# Patient Record
Sex: Male | Born: 1955 | Race: White | Hispanic: No | Marital: Single | Smoking: Current every day smoker
Health system: Southern US, Community
[De-identification: ages and names within clinical notes are randomized; demographics above are authoritative.]

## PROBLEM LIST (undated history)

## (undated) DIAGNOSIS — R4182 Altered mental status, unspecified: Secondary | ICD-10-CM

## (undated) DIAGNOSIS — E871 Hypo-osmolality and hyponatremia: Secondary | ICD-10-CM

## (undated) DIAGNOSIS — I10 Essential (primary) hypertension: Secondary | ICD-10-CM

## (undated) HISTORY — PX: HERNIA REPAIR: SHX51

## (undated) HISTORY — DX: Hypo-osmolality and hyponatremia: E87.1

## (undated) HISTORY — DX: Altered mental status, unspecified: R41.82

---

## 2016-02-04 DIAGNOSIS — I10 Essential (primary) hypertension: Secondary | ICD-10-CM | POA: Insufficient documentation

## 2017-03-17 DIAGNOSIS — I739 Peripheral vascular disease, unspecified: Secondary | ICD-10-CM | POA: Insufficient documentation

## 2017-03-17 DIAGNOSIS — R9431 Abnormal electrocardiogram [ECG] [EKG]: Secondary | ICD-10-CM

## 2017-03-17 DIAGNOSIS — F17209 Nicotine dependence, unspecified, with unspecified nicotine-induced disorders: Secondary | ICD-10-CM | POA: Insufficient documentation

## 2017-03-17 DIAGNOSIS — J4520 Mild intermittent asthma, uncomplicated: Secondary | ICD-10-CM | POA: Insufficient documentation

## 2017-03-17 DIAGNOSIS — K439 Ventral hernia without obstruction or gangrene: Secondary | ICD-10-CM | POA: Insufficient documentation

## 2017-03-17 DIAGNOSIS — E669 Obesity, unspecified: Secondary | ICD-10-CM | POA: Insufficient documentation

## 2017-03-17 DIAGNOSIS — D229 Melanocytic nevi, unspecified: Secondary | ICD-10-CM | POA: Insufficient documentation

## 2017-03-17 DIAGNOSIS — E66812 Obesity, class 2: Secondary | ICD-10-CM | POA: Insufficient documentation

## 2017-03-17 DIAGNOSIS — J310 Chronic rhinitis: Secondary | ICD-10-CM | POA: Insufficient documentation

## 2017-03-17 DIAGNOSIS — R6882 Decreased libido: Secondary | ICD-10-CM | POA: Insufficient documentation

## 2017-03-17 DIAGNOSIS — K429 Umbilical hernia without obstruction or gangrene: Secondary | ICD-10-CM | POA: Insufficient documentation

## 2017-03-17 HISTORY — DX: Abnormal electrocardiogram (ECG) (EKG): R94.31

## 2019-02-19 ENCOUNTER — Emergency Department (HOSPITAL_BASED_OUTPATIENT_CLINIC_OR_DEPARTMENT_OTHER): Payer: Self-pay

## 2019-02-19 ENCOUNTER — Encounter (HOSPITAL_BASED_OUTPATIENT_CLINIC_OR_DEPARTMENT_OTHER): Payer: Self-pay | Admitting: Emergency Medicine

## 2019-02-19 ENCOUNTER — Inpatient Hospital Stay (HOSPITAL_BASED_OUTPATIENT_CLINIC_OR_DEPARTMENT_OTHER)
Admission: EM | Admit: 2019-02-19 | Discharge: 2019-02-22 | DRG: 640 | Disposition: A | Discharge status: Home / Self Care | Payer: Self-pay | Attending: Family Medicine | Admitting: Family Medicine

## 2019-02-19 ENCOUNTER — Other Ambulatory Visit: Payer: Self-pay

## 2019-02-19 DIAGNOSIS — D72829 Elevated white blood cell count, unspecified: Secondary | ICD-10-CM | POA: Diagnosis present

## 2019-02-19 DIAGNOSIS — E876 Hypokalemia: Secondary | ICD-10-CM | POA: Diagnosis present

## 2019-02-19 DIAGNOSIS — F22 Delusional disorders: Secondary | ICD-10-CM | POA: Diagnosis present

## 2019-02-19 DIAGNOSIS — Z560 Unemployment, unspecified: Secondary | ICD-10-CM

## 2019-02-19 DIAGNOSIS — F439 Reaction to severe stress, unspecified: Secondary | ICD-10-CM | POA: Diagnosis present

## 2019-02-19 DIAGNOSIS — F419 Anxiety disorder, unspecified: Secondary | ICD-10-CM | POA: Diagnosis present

## 2019-02-19 DIAGNOSIS — E871 Hypo-osmolality and hyponatremia: Principal | ICD-10-CM | POA: Diagnosis present

## 2019-02-19 DIAGNOSIS — F39 Unspecified mood [affective] disorder: Secondary | ICD-10-CM | POA: Diagnosis present

## 2019-02-19 DIAGNOSIS — K439 Ventral hernia without obstruction or gangrene: Secondary | ICD-10-CM | POA: Diagnosis present

## 2019-02-19 DIAGNOSIS — R0602 Shortness of breath: Secondary | ICD-10-CM

## 2019-02-19 DIAGNOSIS — F17210 Nicotine dependence, cigarettes, uncomplicated: Secondary | ICD-10-CM | POA: Diagnosis present

## 2019-02-19 DIAGNOSIS — E872 Acidosis, unspecified: Secondary | ICD-10-CM | POA: Diagnosis present

## 2019-02-19 DIAGNOSIS — F17200 Nicotine dependence, unspecified, uncomplicated: Secondary | ICD-10-CM | POA: Diagnosis present

## 2019-02-19 DIAGNOSIS — G9341 Metabolic encephalopathy: Secondary | ICD-10-CM | POA: Diagnosis present

## 2019-02-19 DIAGNOSIS — R4182 Altered mental status, unspecified: Secondary | ICD-10-CM

## 2019-02-19 DIAGNOSIS — T502X5A Adverse effect of carbonic-anhydrase inhibitors, benzothiadiazides and other diuretics, initial encounter: Secondary | ICD-10-CM | POA: Diagnosis present

## 2019-02-19 DIAGNOSIS — I10 Essential (primary) hypertension: Secondary | ICD-10-CM | POA: Diagnosis present

## 2019-02-19 DIAGNOSIS — G4700 Insomnia, unspecified: Secondary | ICD-10-CM | POA: Diagnosis present

## 2019-02-19 HISTORY — DX: Essential (primary) hypertension: I10

## 2019-02-19 LAB — CBC
HCT: 43 % (ref 39.0–52.0)
Hemoglobin: 14.9 g/dL (ref 13.0–17.0)
MCH: 30.5 pg (ref 26.0–34.0)
MCHC: 34.7 g/dL (ref 30.0–36.0)
MCV: 87.9 fL (ref 80.0–100.0)
Platelets: 283 10*3/uL (ref 150–400)
RBC: 4.89 MIL/uL (ref 4.22–5.81)
RDW: 12.7 % (ref 11.5–15.5)
WBC: 18.3 10*3/uL — ABNORMAL HIGH (ref 4.0–10.5)
nRBC: 0 % (ref 0.0–0.2)

## 2019-02-19 LAB — COMPREHENSIVE METABOLIC PANEL
ALBUMIN: 3.9 g/dL (ref 3.5–5.0)
ALT: 33 U/L (ref 0–44)
AST: 32 U/L (ref 15–41)
Alkaline Phosphatase: 75 U/L (ref 38–126)
Anion gap: 11 (ref 5–15)
BILIRUBIN TOTAL: 0.5 mg/dL (ref 0.3–1.2)
BUN: 11 mg/dL (ref 6–20)
CALCIUM: 8.5 mg/dL — AB (ref 8.9–10.3)
CO2: 20 mmol/L — ABNORMAL LOW (ref 22–32)
Chloride: 87 mmol/L — ABNORMAL LOW (ref 98–111)
Creatinine, Ser: 0.84 mg/dL (ref 0.61–1.24)
GFR calc Af Amer: >60 mL/min (ref >60)
GFR calc non Af Amer: >60 mL/min (ref >60)
Glucose, Bld: 100 mg/dL — ABNORMAL HIGH (ref 70–99)
Potassium: 3.1 mmol/L — ABNORMAL LOW (ref 3.5–5.1)
Sodium: 118 mmol/L — CL (ref 135–145)
Total Protein: 6.8 g/dL (ref 6.5–8.1)

## 2019-02-19 LAB — AMMONIA: AMMONIA: 18 umol/L (ref 9–35)

## 2019-02-19 LAB — ETHANOL

## 2019-02-19 LAB — RAPID URINE DRUG SCREEN, HOSP PERFORMED
Amphetamines: NOT DETECTED
Barbiturates: NOT DETECTED
Benzodiazepines: NOT DETECTED
Cocaine: NOT DETECTED
Opiates: NOT DETECTED
Tetrahydrocannabinol: NOT DETECTED

## 2019-02-19 MED ORDER — SODIUM CHLORIDE 0.9 % IV SOLN
INTRAVENOUS | Status: DC
  Administered 2019-02-19: via INTRAVENOUS

## 2019-02-19 MED ORDER — SODIUM CHLORIDE 0.9 % IV BOLUS
1000.0000 mL | Freq: Once | INTRAVENOUS | Status: AC
  Administered 2019-02-19: 1000 mL via INTRAVENOUS

## 2019-02-19 MED ORDER — POTASSIUM CHLORIDE 10 MEQ/100ML IV SOLN
10.0000 meq | Freq: Once | INTRAVENOUS | Status: AC
  Administered 2019-02-19: 10 meq via INTRAVENOUS
  Filled 2019-02-19: qty 100

## 2019-02-19 NOTE — ED Notes (Signed)
Pt forgot to give urine sample.

## 2019-02-19 NOTE — ED Notes (Signed)
Date and time results received: 02/19/19 2140  Test: sodium Critical Value: 118  Name of Provider Notified: Dr. Stark Jock  Orders Received? Or Actions Taken?: no new orders at this time.

## 2019-02-19 NOTE — ED Notes (Signed)
Pt refusing to be admitted. Explained to pt process and benefit. Pt still refuses. Family with pt. MD informed

## 2019-02-19 NOTE — ED Notes (Signed)
ED Provider at bedside. 

## 2019-02-19 NOTE — ED Notes (Signed)
PT refusing to be on cardiac monitor.

## 2019-02-19 NOTE — ED Notes (Signed)
PT refusing blood work. Wants RN to come back in 5 minutes to see if he wants to be admitted

## 2019-02-19 NOTE — ED Notes (Signed)
PT states he is very anxious and hasnt been sleeping. Denies thoughts of harming self.

## 2019-02-19 NOTE — ED Triage Notes (Signed)
Per family pt is more disoriented today, pt not able to drink or eat that much today, pt states he is been worry more about different stuff and not able to sleep well since last Friday, pt is AO x 4 on triage no neuro deficit noticed.

## 2019-02-19 NOTE — ED Notes (Signed)
ED Provider at bedside.Explained importance. Pt still refusing. PT states he will be admitted.

## 2019-02-19 NOTE — ED Notes (Signed)
Pt refusing CT scan. MD informed

## 2019-02-19 NOTE — ED Provider Notes (Signed)
[facility] Provider Note   CSN: [id number] Arrival date & time: 02/19/19  1902    History   Chief Complaint Chief Complaint  Patient presents with  . Altered Mental Status    HPI Francis Soto is a 63 y.o. male.     Patient is a 63 year old male presenting to the emergency department accompanied by his sister and his niece for evaluation of confusion and altered mental status.  According to the family, this patient recently lost his job of 14 years and has been very stressed out since.  They state that he has not been eating or drinking as much as normal and has not been sleeping as he has felt very stressed out.  The family is noticing that he is having issues with short-term memory and not quite understanding instructions.  Patient denies to me that he is experiencing any discomfort.  The family tells me there has been no history of alcoholism or substance abuse.  He has no history of mental illness.  The history is provided by the patient and a relative (Patient's sister and niece).  Altered Mental Status  Presenting symptoms: behavior changes and disorientation   Severity:  Moderate Episode history:  Continuous Timing:  Constant Progression:  Worsening Chronicity:  New Context: not alcohol use, not dementia, not drug use and not head injury     Past Medical History:  Diagnosis Date  . Hypertension     There are no active problems to display for this patient.   History reviewed. No pertinent surgical history.      Home Medications    Prior to Admission medications   Medication Sig Start Date End Date Taking? Authorizing Provider  fluticasone (FLONASE) 50 MCG/ACT nasal spray USE 1 SPRAY IN EACH NOSTRIL DAILY 02/14/19  Yes [provider]  lisinopril-hydrochlorothiazide (PRINZIDE,ZESTORETIC) 20-25 MG tablet Take by mouth. 02/12/19 02/12/20 Yes [provider]    Family History History reviewed. No pertinent family  history.  Social History Social History   Tobacco Use  . Smoking status: Current Every Day Smoker    Packs/day: 1.00    Types: Cigarettes  . Smokeless tobacco: Never Used  Substance Use Topics  . Alcohol use: Never    Frequency: Never  . Drug use: Never     Allergies   Patient has no known allergies.   Review of Systems Review of Systems  All other systems reviewed and are negative.    Physical Exam Updated Vital Signs BP (!) 177/90 (BP Location: Right Arm)   Pulse (!) 101   Temp 98.3 F (36.8 C) (Oral)   Resp 20   Ht 5\' 7"  (1.702 m)   Wt 95.3 kg   SpO2 100%   BMI 32.89 kg/m   Physical Exam Vitals signs and nursing note reviewed.  Constitutional:      General: He is not in acute distress.    Appearance: He is well-developed. He is not diaphoretic.  HENT:     Head: Normocephalic and atraumatic.  Eyes:     Extraocular Movements: Extraocular movements intact.     Pupils: Pupils are equal, round, and reactive to light.  Neck:     Musculoskeletal: Normal range of motion and neck supple.  Cardiovascular:     Rate and Rhythm: Normal rate and regular rhythm.     Heart sounds: No murmur. No friction rub.  Pulmonary:     Effort: Pulmonary effort is normal. No respiratory distress.  Breath sounds: Normal breath sounds. No wheezing or rales.  Abdominal:     General: Bowel sounds are normal. There is no distension.     Palpations: Abdomen is soft.     Tenderness: There is no abdominal tenderness.  Musculoskeletal: Normal range of motion.  Skin:    General: Skin is warm and dry.  Neurological:     General: No focal deficit present.     Mental Status: He is alert and oriented to person, place, and time.     Cranial Nerves: No cranial nerve deficit.     Motor: No weakness.     Coordination: Coordination normal.      ED Treatments / Results  Labs (all labs ordered are listed, but only abnormal results are displayed) Labs Reviewed  COMPREHENSIVE  METABOLIC PANEL  CBC  ETHANOL  RAPID URINE DRUG SCREEN, HOSP PERFORMED  AMMONIA    EKG None  Radiology No results found.  Procedures Procedures (including critical care time)  Medications Ordered in ED Medications  sodium chloride 0.9 % bolus 1,000 mL (has no administration in time range)     Initial Impression / Assessment and Plan / ED Course  I have reviewed the triage vital signs and the nursing notes.  Pertinent labs & imaging results that were available during my care of the patient were reviewed by me and considered in my medical decision making (see chart for details).  Patient presents here with complaints of weakness and confusion that has worsened over the past several weeks.  The patient recently lost his job after 14 years of employment and has been stressed out over this.  He states he has not been sleeping or eating much during this period of time.  Today's work-up reveals a sodium of 118 with a potassium of 3.1, which I suspect is the cause of his confusion.  He was given 1 L of normal saline and will be admitted to the hospitalist service under the care of Dr. Blaine Hamper.  The patient will need additional studies to determine the cause of his hyponatremia.  At this point he is declining to allow me to perform a head CT.  Hopefully he will reconsider once seen by the hospitalist.  Patient was also very reluctant to be admitted.  After several conversations with the patient and the patient's family, he ultimately decided to stay.  Final Clinical Impressions(s) / ED Diagnoses   Final diagnoses:  None    ED Discharge Orders    None       Veryl Speak, MD 02/19/19 2325

## 2019-02-19 NOTE — ED Notes (Signed)
MD in to see pt. Pt agreeing to admission. Consent obtained.

## 2019-02-20 ENCOUNTER — Inpatient Hospital Stay (HOSPITAL_COMMUNITY): Payer: Self-pay

## 2019-02-20 DIAGNOSIS — G9341 Metabolic encephalopathy: Secondary | ICD-10-CM | POA: Diagnosis present

## 2019-02-20 DIAGNOSIS — E872 Acidosis, unspecified: Secondary | ICD-10-CM | POA: Diagnosis present

## 2019-02-20 DIAGNOSIS — F17200 Nicotine dependence, unspecified, uncomplicated: Secondary | ICD-10-CM | POA: Diagnosis present

## 2019-02-20 DIAGNOSIS — E876 Hypokalemia: Secondary | ICD-10-CM | POA: Diagnosis present

## 2019-02-20 DIAGNOSIS — G4700 Insomnia, unspecified: Secondary | ICD-10-CM | POA: Diagnosis present

## 2019-02-20 DIAGNOSIS — D72829 Elevated white blood cell count, unspecified: Secondary | ICD-10-CM | POA: Diagnosis present

## 2019-02-20 DIAGNOSIS — E871 Hypo-osmolality and hyponatremia: Principal | ICD-10-CM

## 2019-02-20 HISTORY — DX: Metabolic encephalopathy: G93.41

## 2019-02-20 HISTORY — DX: Acidosis, unspecified: E87.20

## 2019-02-20 HISTORY — DX: Hypokalemia: E87.6

## 2019-02-20 LAB — BRAIN NATRIURETIC PEPTIDE: B Natriuretic Peptide: 49 pg/mL (ref 0.0–100.0)

## 2019-02-20 LAB — URIC ACID: URIC ACID, SERUM: 5.4 mg/dL (ref 3.7–8.6)

## 2019-02-20 LAB — BASIC METABOLIC PANEL
Anion gap: 11 (ref 5–15)
Anion gap: 11 (ref 5–15)
Anion gap: 8 (ref 5–15)
Anion gap: 6 (ref 5–15)
BUN: 8 mg/dL (ref 6–20)
BUN: 9 mg/dL (ref 6–20)
BUN: 9 mg/dL (ref 6–20)
BUN: 13 mg/dL (ref 6–20)
CALCIUM: 8.4 mg/dL — AB (ref 8.9–10.3)
CO2: 22 mmol/L (ref 22–32)
CO2: 19 mmol/L — ABNORMAL LOW (ref 22–32)
CO2: 20 mmol/L — ABNORMAL LOW (ref 22–32)
CO2: 22 mmol/L (ref 22–32)
Calcium: 8.3 mg/dL — ABNORMAL LOW (ref 8.9–10.3)
Calcium: 8.5 mg/dL — ABNORMAL LOW (ref 8.9–10.3)
Calcium: 8.6 mg/dL — ABNORMAL LOW (ref 8.9–10.3)
Chloride: 93 mmol/L — ABNORMAL LOW (ref 98–111)
Chloride: 98 mmol/L (ref 98–111)
Chloride: 102 mmol/L (ref 98–111)
Chloride: 104 mmol/L (ref 98–111)
Creatinine, Ser: 0.9 mg/dL (ref 0.61–1.24)
Creatinine, Ser: 0.98 mg/dL (ref 0.61–1.24)
Creatinine, Ser: 0.95 mg/dL (ref 0.61–1.24)
Creatinine, Ser: 1.14 mg/dL (ref 0.61–1.24)
GFR calc Af Amer: >60 mL/min (ref >60)
GFR calc Af Amer: >60 mL/min (ref >60)
GFR calc Af Amer: >60 mL/min (ref >60)
GFR calc Af Amer: >60 mL/min (ref >60)
GFR calc non Af Amer: >60 mL/min (ref >60)
GFR calc non Af Amer: >60 mL/min (ref >60)
GFR calc non Af Amer: >60 mL/min (ref >60)
GFR calc non Af Amer: >60 mL/min (ref >60)
GLUCOSE: 87 mg/dL (ref 70–99)
Glucose, Bld: 108 mg/dL — ABNORMAL HIGH (ref 70–99)
Glucose, Bld: 146 mg/dL — ABNORMAL HIGH (ref 70–99)
Glucose, Bld: 110 mg/dL — ABNORMAL HIGH (ref 70–99)
Potassium: 3.1 mmol/L — ABNORMAL LOW (ref 3.5–5.1)
Potassium: 3.2 mmol/L — ABNORMAL LOW (ref 3.5–5.1)
Potassium: 4.4 mmol/L (ref 3.5–5.1)
Potassium: 4.2 mmol/L (ref 3.5–5.1)
Sodium: 126 mmol/L — ABNORMAL LOW (ref 135–145)
Sodium: 128 mmol/L — ABNORMAL LOW (ref 135–145)
Sodium: 130 mmol/L — ABNORMAL LOW (ref 135–145)
Sodium: 132 mmol/L — ABNORMAL LOW (ref 135–145)

## 2019-02-20 LAB — SODIUM, URINE, RANDOM: Sodium, Ur: <10 mmol/L

## 2019-02-20 LAB — CBC WITH DIFFERENTIAL/PLATELET
ABS IMMATURE GRANULOCYTES: 0.06 10*3/uL (ref 0.00–0.07)
Basophils Absolute: 0.1 10*3/uL (ref 0.0–0.1)
Basophils Relative: 1 %
Eosinophils Absolute: 0.2 10*3/uL (ref 0.0–0.5)
Eosinophils Relative: 2 %
HCT: 43.3 % (ref 39.0–52.0)
Hemoglobin: 14.7 g/dL (ref 13.0–17.0)
IMMATURE GRANULOCYTES: 1 %
LYMPHS PCT: 20 %
Lymphs Abs: 2.2 10*3/uL (ref 0.7–4.0)
MCH: 31 pg (ref 26.0–34.0)
MCHC: 33.9 g/dL (ref 30.0–36.0)
MCV: 91.4 fL (ref 80.0–100.0)
Monocytes Absolute: 1.4 10*3/uL — ABNORMAL HIGH (ref 0.1–1.0)
Monocytes Relative: 12 %
Neutro Abs: 7.2 10*3/uL (ref 1.7–7.7)
Neutrophils Relative %: 64 %
Platelets: 269 10*3/uL (ref 150–400)
RBC: 4.74 MIL/uL (ref 4.22–5.81)
RDW: 13.1 % (ref 11.5–15.5)
WBC: 11 10*3/uL — ABNORMAL HIGH (ref 4.0–10.5)
nRBC: 0 % (ref 0.0–0.2)

## 2019-02-20 LAB — CORTISOL: CORTISOL PLASMA: 13.3 ug/dL

## 2019-02-20 LAB — OSMOLALITY, URINE: Osmolality, Ur: 105 mOsm/kg — ABNORMAL LOW (ref 300–900)

## 2019-02-20 LAB — OSMOLALITY
Osmolality: 253 mOsm/kg — ABNORMAL LOW (ref 275–295)
Osmolality: 264 mOsm/kg — ABNORMAL LOW (ref 275–295)

## 2019-02-20 LAB — URINALYSIS, ROUTINE W REFLEX MICROSCOPIC
Bilirubin Urine: NEGATIVE
Glucose, UA: NEGATIVE mg/dL
HGB URINE DIPSTICK: NEGATIVE
Ketones, ur: NEGATIVE mg/dL
Leukocytes,Ua: NEGATIVE
Nitrite: NEGATIVE
Protein, ur: NEGATIVE mg/dL
SPECIFIC GRAVITY, URINE: 1.003 — AB (ref 1.005–1.030)
pH: 7 (ref 5.0–8.0)

## 2019-02-20 LAB — MRSA PCR SCREENING: MRSA BY PCR: NEGATIVE

## 2019-02-20 LAB — MAGNESIUM: Magnesium: 2.1 mg/dL (ref 1.7–2.4)

## 2019-02-20 LAB — CREATININE, URINE, RANDOM: Creatinine, Urine: 34.23 mg/dL

## 2019-02-20 LAB — TSH: TSH: 1.207 u[IU]/mL (ref 0.350–4.500)

## 2019-02-20 MED ORDER — HYDRALAZINE HCL 20 MG/ML IJ SOLN
5.0000 mg | INTRAMUSCULAR | Status: DC | PRN
  Administered 2019-02-21: 5 mg via INTRAVENOUS
  Filled 2019-02-20: qty 1

## 2019-02-20 MED ORDER — ACETAMINOPHEN 650 MG RE SUPP: 650.0000 mg | Freq: Four times a day (QID) | RECTAL | Status: DC | PRN

## 2019-02-20 MED ORDER — POTASSIUM CHLORIDE CRYS ER 20 MEQ PO TBCR
40.0000 meq | EXTENDED_RELEASE_TABLET | ORAL | Status: AC
  Administered 2019-02-20 (×2): 40 meq via ORAL
  Filled 2019-02-20 (×2): qty 2

## 2019-02-20 MED ORDER — ALBUTEROL SULFATE HFA 108 (90 BASE) MCG/ACT IN AERS: 2.0000 | INHALATION_SPRAY | Freq: Three times a day (TID) | RESPIRATORY_TRACT | Status: DC

## 2019-02-20 MED ORDER — ACETAMINOPHEN 325 MG PO TABS: 650.0000 mg | ORAL_TABLET | Freq: Four times a day (QID) | ORAL | Status: DC | PRN

## 2019-02-20 MED ORDER — NICOTINE 21 MG/24HR TD PT24
21.0000 mg | MEDICATED_PATCH | Freq: Every day | TRANSDERMAL | Status: DC
  Administered 2019-02-20 – 2019-02-21 (×2): 21 mg via TRANSDERMAL
  Filled 2019-02-20 (×2): qty 1

## 2019-02-20 MED ORDER — POTASSIUM CHLORIDE 20 MEQ/15ML (10%) PO SOLN
40.0000 meq | Freq: Once | ORAL | Status: AC
  Administered 2019-02-20: 40 meq via ORAL
  Filled 2019-02-20: qty 30

## 2019-02-20 MED ORDER — NAPHAZOLINE-GLYCERIN 0.012-0.2 % OP SOLN: 1.0000 [drp] | Freq: Four times a day (QID) | OPHTHALMIC | Status: DC | PRN

## 2019-02-20 MED ORDER — FLUTICASONE PROPIONATE 50 MCG/ACT NA SUSP
1.0000 | Freq: Every day | NASAL | Status: DC
  Administered 2019-02-20 – 2019-02-21 (×2): 1 via NASAL
  Filled 2019-02-20: qty 16

## 2019-02-20 MED ORDER — LISINOPRIL 10 MG PO TABS: 20.0000 mg | ORAL_TABLET | Freq: Every day | ORAL | Status: DC

## 2019-02-20 MED ORDER — CHLORHEXIDINE GLUCONATE CLOTH 2 % EX PADS: 6.0000 | MEDICATED_PAD | Freq: Every day | CUTANEOUS | Status: DC

## 2019-02-20 MED ORDER — ALBUTEROL SULFATE HFA 108 (90 BASE) MCG/ACT IN AERS: 2.0000 | INHALATION_SPRAY | RESPIRATORY_TRACT | Status: DC | PRN

## 2019-02-20 MED ORDER — SODIUM CHLORIDE 0.9% FLUSH
3.0000 mL | Freq: Two times a day (BID) | INTRAVENOUS | Status: DC
  Administered 2019-02-20 – 2019-02-21 (×4): 3 mL via INTRAVENOUS

## 2019-02-20 MED ORDER — ASPIRIN 325 MG PO TABS
325.0000 mg | ORAL_TABLET | Freq: Every day | ORAL | Status: DC
  Administered 2019-02-20 – 2019-02-22 (×3): 325 mg via ORAL
  Filled 2019-02-20 (×3): qty 1

## 2019-02-20 MED ORDER — ALBUTEROL SULFATE (2.5 MG/3ML) 0.083% IN NEBU: 2.5000 mg | INHALATION_SOLUTION | Freq: Four times a day (QID) | RESPIRATORY_TRACT | Status: DC | PRN

## 2019-02-20 MED ORDER — AMLODIPINE BESYLATE 5 MG PO TABS
5.0000 mg | ORAL_TABLET | Freq: Every day | ORAL | Status: DC
  Administered 2019-02-20 – 2019-02-21 (×2): 5 mg via ORAL
  Filled 2019-02-20 (×2): qty 1

## 2019-02-20 MED ORDER — ONDANSETRON HCL 4 MG PO TABS: 4.0000 mg | ORAL_TABLET | Freq: Four times a day (QID) | ORAL | Status: DC | PRN

## 2019-02-20 MED ORDER — ONDANSETRON HCL 4 MG/2ML IJ SOLN: 4.0000 mg | Freq: Four times a day (QID) | INTRAMUSCULAR | Status: DC | PRN

## 2019-02-20 MED ORDER — DEXTROSE 5 % IV SOLN
INTRAVENOUS | Status: DC
  Administered 2019-02-20: 07:00:00 via INTRAVENOUS

## 2019-02-20 MED ORDER — NAPHAZOLINE-PHENIRAMINE 0.025-0.3 % OP SOLN
1.0000 [drp] | Freq: Four times a day (QID) | OPHTHALMIC | Status: DC | PRN
  Administered 2019-02-20: 1 [drp] via OPHTHALMIC
  Filled 2019-02-20 (×2): qty 15

## 2019-02-20 MED ORDER — ALBUTEROL SULFATE (2.5 MG/3ML) 0.083% IN NEBU: 2.5000 mg | INHALATION_SOLUTION | Freq: Three times a day (TID) | RESPIRATORY_TRACT | Status: DC

## 2019-02-20 MED ORDER — ONDANSETRON HCL 4 MG/2ML IJ SOLN: 4.0000 mg | Freq: Three times a day (TID) | INTRAMUSCULAR | Status: DC | PRN

## 2019-02-20 MED ORDER — SODIUM CHLORIDE 0.9 % IV SOLN
INTRAVENOUS | Status: DC
  Administered 2019-02-20: 10:00:00 via INTRAVENOUS

## 2019-02-20 MED ORDER — ENOXAPARIN SODIUM 40 MG/0.4ML ~~LOC~~ SOLN
40.0000 mg | SUBCUTANEOUS | Status: DC
  Administered 2019-02-20: 40 mg via SUBCUTANEOUS
  Filled 2019-02-20 (×3): qty 0.4

## 2019-02-20 NOTE — Care Management (Addendum)
This is a no charge note  Transfer from [facility] per Dr. Stark Jock,  63 year old man with past medical history of hypertension, tobacco abuse, who presents with altered mental status, insomnia, decreased oral intake for 1-2weeks per ED physician.  Patient was found to have sodium 118, WBC 18.2, negative UDS, ammonia level 18, alcohol level less than 10, renal function okay. Patient refused CT head.  Patient was given 1 L normal saline bolus in the ED. Will order urinalysis and blood culture. Will check stat BMP now for sodium level, then decide the rate of NS for next step. Pt is admitted to [facility] as inpt.    Addendum: after giving 1L NS bolus in ED, Na increased from 118 to 126 quickly, which is too fast. Will start D5 at 50 cc/h    Ivor Costa, MD  [facility]   If 7PM-7AM, please contact night-coverage [email or web address] Password [email or web address] 02/20/2019, 1:31 AM

## 2019-02-20 NOTE — Progress Notes (Signed)
Patient walked to bathroom unassisted after nurse tech unhooked telebox from room monitor, bowel urgency caused patient to have accident on the way to bathroom and IV tubing was disconnected on transfer.   Linens/pt gown changed, facilities assisted with cleaning bathroom, IV access redressed/new dressing placed and new IV tubing placed.   Educated patient on safety with ambulating and when to ask for help, pt verbalized understanding.

## 2019-02-20 NOTE — Progress Notes (Signed)
Patient assessed per RT protocol. BBS clear, no ShOB per patient, and no O2 requirements. Patient 98-99% on RA with no distress noted. Spoke to patient about scheduled treatments and patient wishes to be PRN at this time. Patient encouraged to notify if needed. RT will continue to monitor patient.

## 2019-02-20 NOTE — H&P (Signed)
[facility] History and Physical   Patient: Francis Soto BEE:[id number]   PCP: No primary care provider on file. DOB: April 11, 1956   DOA: 02/19/2019   DOS: 02/20/2019   DOS: the patient was seen and examined on 02/19/2019  Patient coming from: The patient is coming from home.  Chief Complaint: confusion  HPI: Francis Soto is a 63 y.o. male with Past medical history of hypertension, active smoker. The patient presents with complaints of confusion. Patient was brought in by family members as the patient was noted to have some short-term memory loss, was unable to follow command and was also not speaking coherently. 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 Patient is also sleeping less with 1 to 2 hours of sleep at a stretch. Denies any weight loss. Denies any nausea or vomiting.  No fever no chills.  No chest pain.  Abdominal pain.  No dizziness lightheadedness.  No diarrhea no constipation.  No burning urination. Denies any alcohol abuse.  No other substance abuse. He takes 6 aspirin full dose a day for generalized body ache and knee pain.  No injury reported as well.   Smokes 2-1/2 pack a day, currently willing to quit. He reports that he does not go to see his doctor on a frequent basis. He has a ventral hernia which she reports has been present for a while, in the beginning it was reducible but since last few years it is not reducible but does not have any pain.  ED Course: Presents with confusion brought in by family, found to have sodium of 118, given IV hydration and was referred for admission.  At his baseline ambulates without support And is independent for most of his ADL; manages his medication on his own.  Review of Systems: as mentioned in the history of present illness.  All other systems reviewed and are negative.  Past Medical  History:  Diagnosis Date  . Hypertension    History reviewed. No pertinent surgical history. Social History:  reports that he has been smoking cigarettes. He has been smoking about 1.00 pack per day. He has never used smokeless tobacco. He reports that he does not drink alcohol or use drugs.  No Known Allergies  History reviewed. No pertinent family history.   Prior to Admission medications   Medication Sig Start Date End Date Taking? Authorizing Provider  aspirin 325 MG tablet Take 650 mg by mouth 3 (three) times daily.   Yes [provider]  fluticasone (FLONASE) 50 MCG/ACT nasal spray Place 1 spray into both nostrils daily.  02/14/19  Yes [provider]  lisinopril-hydrochlorothiazide (PRINZIDE,ZESTORETIC) 20-25 MG tablet Take 1 tablet by mouth daily.  02/12/19 02/12/20 Yes [provider]    Physical Exam: Vitals:   02/20/19 0200 02/20/19 0435 02/20/19 0700 02/20/19 0815  BP: (!) 146/84     Pulse: 68  85   Resp:      Temp:  98 F (36.7 C)  98.9 F (37.2 C)  TempSrc:  Axillary  Oral  SpO2: 94%  96%   Weight:      Height:        General: Alert, Awake and Oriented to Time, Place and Person. Appear in mild distress, affect appropriate Eyes: PERRL, Conjunctiva normal ENT: Oral Mucosa clear moist. Neck: no JVD, no Abnormal Mass Or lumps Cardiovascular: S1 and S2  Present, no Murmur, Peripheral Pulses Present Respiratory: normal respiratory effort, Bilateral Air entry equal and Decreased, no use of accessory muscle, no Crackles, bilateral  wheezes Abdomen: Bowel Sound present, Soft and no tenderness, nonreducible ventral hernia Skin: no redness, no Rash, no induration Extremities: no Pedal edema, no calf tenderness Neurologic: Grossly no focal neuro deficit. Bilaterally Equal motor strength  Labs on Admission:  CBC: Recent Labs  Lab 02/19/19 2050  WBC 18.3*  HGB 14.9  HCT 43.0  MCV 87.9  PLT 673   Basic Metabolic Panel: Recent Labs  Lab  02/19/19 2050 02/20/19 0209 02/20/19 0754  NA 118* 126* 128*  K 3.1* 3.1* 3.2*  CL 87* 93* 98  CO2 20* 22 19*  GLUCOSE 100* 108* 146*  BUN 11 8 9   CREATININE 0.84 0.90 0.98  CALCIUM 8.5* 8.4* 8.3*   GFR: Estimated Creatinine Clearance: 88.2 mL/min (by C-G formula based on SCr of 0.98 mg/dL). Liver Function Tests: Recent Labs  Lab 02/19/19 2050  AST 32  ALT 33  ALKPHOS 75  BILITOT 0.5  PROT 6.8  ALBUMIN 3.9   No results for input(s): LIPASE, AMYLASE in the last 168 hours. Recent Labs  Lab 02/19/19 2100  AMMONIA 18   Coagulation Profile: No results for input(s): INR, PROTIME in the last 168 hours. Cardiac Enzymes: No results for input(s): CKTOTAL, CKMB, CKMBINDEX, TROPONINI in the last 168 hours. BNP (last 3 results) No results for input(s): PROBNP in the last 8760 hours. HbA1C: No results for input(s): HGBA1C in the last 72 hours. CBG: No results for input(s): GLUCAP in the last 168 hours. Lipid Profile: No results for input(s): CHOL, HDL, LDLCALC, TRIG, CHOLHDL, LDLDIRECT in the last 72 hours. Thyroid Function Tests: Recent Labs    02/20/19 0209  TSH 1.207   Anemia Panel: No results for input(s): VITAMINB12, FOLATE, FERRITIN, TIBC, IRON, RETICCTPCT in the last 72 hours. Urine analysis:    Component Value Date/Time   COLORURINE STRAW (A) 02/20/2019 0530   APPEARANCEUR CLEAR 02/20/2019 0530   LABSPEC 1.003 (L) 02/20/2019 0530   PHURINE 7.0 02/20/2019 0530   GLUCOSEU NEGATIVE 02/20/2019 0530   HGBUR NEGATIVE 02/20/2019 0530   BILIRUBINUR NEGATIVE 02/20/2019 0530   KETONESUR NEGATIVE 02/20/2019 0530   PROTEINUR NEGATIVE 02/20/2019 0530   NITRITE NEGATIVE 02/20/2019 0530   LEUKOCYTESUR NEGATIVE 02/20/2019 0530    Radiological Exams on Admission: No results found. EKG: Independently reviewed. normal sinus rhythm, T wave inversions in lateral leads.  Assessment/Plan 1. Hyponatremia hypoosmolar Acute metabolic encephalopathy The patient presents with  complaints of confusion. Found to have hyponatremia with sodium of 118. Potassium 3.1, TSH 1.2, osmolality 253, urine aspirin 8105, urine sodium undetectable. Patient received IV hydration with normal saline bolus in the ER with which his sodium improved to 126. She also received D5 between 6:52 AM and 7:22 AM at 50 cc/h. Currently repeat sodium is 128. Patient does not have any focal deficit. Significant improvement and patient actually feels that he is at his baseline. Currently differential includes no dietary salt intake HCTZ induced hyponatremia, type IV RTA secondary to lisinopril, SIADH. We will continue with gentle IV hydration for now. Monitor BMP every 4 hours. Check chest x-ray. Cortisol level is ordered. Hold lisinopril and HCTZ. Anticipating sodium to correct to normal later this afternoon and patient can be discharged home tomorrow morning as long as sodium is above 130 and patient asymptomatic.  2.  Hypokalemia. Currently being replaced. 40 mg x 2 ordered. We will recheck later in  the afternoon along with a BMP.  3.  Normal anion gap metabolic acidosis. Patient does not have any diarrhea or GI losses. Possibility that this could be type IV RTA secondary to lisinopril which congested with hyponatremia. Currently will be holding lisinopril. Receiving IV hydration. Monitor.  4.  Hypertension. Blood pressure still elevated. Patient is on lisinopril hydrochlorothiazide. Currently on hold secondary to above. Switch to Norvasc and monitor.  5.  Active smoker. Patient smokes 2-1/2 pack a day. Currently willing to quit. Counseled the patient regarding quit smoking. Patient does have some expiratory wheezing on exam. We will put her on scheduled albuterol inhaler. Nicotine patch ordered as well. Should be given nicotine patch and nicotine gum on the time of the discharge as well as may benefit from being on Wellbutrin as long as the sodium is stable and patient is  willing to follow-up with PCP for repeat sodium.  6.  Insomnia. 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 Patient will benefit from treatment although all the treatment options will lead to hyponatremia. Consider starting Wellbutrin if possible although which will lead to worsening of insomnia. Monitor for now.  7.  Leukocytosis. Likely hemoconcentration and stress reaction. No signs of active infection. Monitor.  8.  Ventral hernia. No tenderness on examination. Not reducible at the time of evaluation. Monitor and outpatient work-up.  Nutrition: regular diet DVT Prophylaxis: subcutaneous Heparin  Advance goals of care discussion: full code   Consults: none  Family Communication: no family was present at bedside, at the time of interview.  Disposition: Admitted as inpatient, step-down unit. Likely to be discharged home, in 1-2 days.  Author: Berle Mull, MD [facility] 02/20/2019  To reach On-call, see care teams to locate the attending and reach out to them via [email or web address]. If 7PM-7AM, please contact night-coverage If you still have difficulty reaching the attending provider, please page the [facility] (Director on Call) for [facility] on amion for assistance.

## 2019-02-21 DIAGNOSIS — E872 Acidosis: Secondary | ICD-10-CM

## 2019-02-21 DIAGNOSIS — E876 Hypokalemia: Secondary | ICD-10-CM

## 2019-02-21 DIAGNOSIS — F22 Delusional disorders: Secondary | ICD-10-CM

## 2019-02-21 DIAGNOSIS — R4182 Altered mental status, unspecified: Secondary | ICD-10-CM

## 2019-02-21 DIAGNOSIS — G4700 Insomnia, unspecified: Secondary | ICD-10-CM

## 2019-02-21 DIAGNOSIS — G9341 Metabolic encephalopathy: Secondary | ICD-10-CM

## 2019-02-21 LAB — BASIC METABOLIC PANEL
Anion gap: 6 (ref 5–15)
Anion gap: 8 (ref 5–15)
BUN: 17 mg/dL (ref 6–20)
BUN: 14 mg/dL (ref 6–20)
CO2: 22 mmol/L (ref 22–32)
CO2: 20 mmol/L — ABNORMAL LOW (ref 22–32)
Calcium: 8.2 mg/dL — ABNORMAL LOW (ref 8.9–10.3)
Calcium: 8.6 mg/dL — ABNORMAL LOW (ref 8.9–10.3)
Chloride: 101 mmol/L (ref 98–111)
Chloride: 102 mmol/L (ref 98–111)
Creatinine, Ser: 1.07 mg/dL (ref 0.61–1.24)
Creatinine, Ser: 0.98 mg/dL (ref 0.61–1.24)
GFR calc Af Amer: >60 mL/min (ref >60)
GFR calc Af Amer: >60 mL/min (ref >60)
GFR calc non Af Amer: >60 mL/min (ref >60)
GFR calc non Af Amer: >60 mL/min (ref >60)
Glucose, Bld: 123 mg/dL — ABNORMAL HIGH (ref 70–99)
Glucose, Bld: 122 mg/dL — ABNORMAL HIGH (ref 70–99)
Potassium: 4.3 mmol/L (ref 3.5–5.1)
Potassium: 4.3 mmol/L (ref 3.5–5.1)
Sodium: 129 mmol/L — ABNORMAL LOW (ref 135–145)
Sodium: 130 mmol/L — ABNORMAL LOW (ref 135–145)

## 2019-02-21 LAB — HIV ANTIBODY (ROUTINE TESTING W REFLEX): HIV Screen 4th Generation wRfx: NONREACTIVE

## 2019-02-21 MED ORDER — OLANZAPINE 2.5 MG PO TABS: 2.5000 mg | ORAL_TABLET | Freq: Once | ORAL | Status: DC

## 2019-02-21 MED ORDER — LISINOPRIL 20 MG PO TABS
20.0000 mg | ORAL_TABLET | Freq: Every day | ORAL | Status: DC
  Administered 2019-02-21 – 2019-02-22 (×2): 20 mg via ORAL
  Filled 2019-02-21: qty 2
  Filled 2019-02-21: qty 1

## 2019-02-21 MED ORDER — OLANZAPINE 2.5 MG PO TABS
2.5000 mg | ORAL_TABLET | Freq: Once | ORAL | Status: AC
  Administered 2019-02-21: 2.5 mg via ORAL
  Filled 2019-02-21: qty 1

## 2019-02-21 MED ORDER — OLANZAPINE 5 MG PO TABS: 5.0000 mg | ORAL_TABLET | Freq: Once | ORAL | Status: DC

## 2019-02-21 MED ORDER — OLANZAPINE 5 MG PO TABS: 5.0000 mg | ORAL_TABLET | Freq: Every day | ORAL | Status: DC

## 2019-02-21 MED ORDER — NAPHAZOLINE-GLYCERIN 0.012-0.2 % OP SOLN: 1.0000 [drp] | Freq: Four times a day (QID) | OPHTHALMIC | Status: DC | PRN

## 2019-02-21 NOTE — Consult Note (Signed)
Francis Soto   Reason for Soto:  Paranoid behavior  Referring Physician:  Dr. Lonny Prude  Patient Identification: [name] MRN:  [id number] Principal Diagnosis: Altered mental status Diagnosis:  Principal Problem:   Hyponatremia Active Problems:   Smoker   Hypokalemia   Metabolic acidosis, normal anion gap (NAG)   Acute metabolic encephalopathy   Leucocytosis   Insomnia   Total Time spent with patient: 1 hour  Subjective:   Francis Soto is a 63 y.o. male patient admitted with hyponatremia.  HPI:   Per chart review, patient was admitted with hyponatremia secondary to HCTZ. Sodium was 118 on admission and is currently 130. His hospital course was complicated by acute metabolic encephalopathy. His mental status appeared to improve but today he exhibited paranoid behavior prior to discharge. He reportedly went to the bathroom and when he came out of the room he questioned where his belongings had been placed. His room was reportedly cleaned since he was being discharged. His sister reports that he has several ongoing stressors. He has been sleeping for 1-2 hours at a time. His appetite is poor. He lost his job.   On interview, Mr. Brine endorses paranoia. He will not allow this notewriter to close the door to his room for privacy and reports, "I feel more comfortable with it open." He reports, "Nothing has been the same since 7 am this morning. I've been watching the hospital staff and no one is in the wing right now. Everything is the same. Everyone is saying what I should hear." He denies feeling like anyone is out to harm him and reports that staff have been helpful. 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 He reports worsening anxiety in the setting of job loss. He has been working with a Chief Executive Officer regarding this situation. He has not been sleeping of eating well. He admits to feeling depressed.  He reports recent confusion although he is oriented to person, place and time on interview. He denies SI, HI or AVH. He provides verbal consent to speak to his sister. She was spoken to in private. She reports that he has been dealing with significant stress since losing his job 2 weeks ago. He has not been sleeping or eating. She became more concerned about his health when he became confused so she brought him to the hospital. She reports that his mental status improved with treatment. He was at his baseline yesterday and he was eating well. Today he became paranoid that people were going to harm him. She believes that he is connecting his personal legal issues to his care in the hospital.   Past Psychiatric History: Denies   Risk to Self:  None. Denies SI.  Risk to Others:  None. Denies HI. Prior Inpatient Therapy:  Denies  Prior Outpatient Therapy:  Denies   Past Medical History:  Past Medical History:  Diagnosis Date  . Hypertension    History reviewed. No pertinent surgical history. Family History: History reviewed. No pertinent family history. Family Psychiatric  History: Denies  Social History:  Social History   Substance and Sexual Activity  Alcohol Use Never  . Frequency: Never     Social History   Substance and Sexual Activity  Drug Use Never    Social History   Socioeconomic History  . Marital status: Single    Spouse name: Not on file  . Number of children: Not on file  .  Years of education: Not on file  . Highest education level: Not on file  Occupational History  . Not on file  Social Needs  . Financial resource strain: Not on file  . Food insecurity:    Worry: Not on file    Inability: Not on file  . Transportation needs:    Medical: Not on file    Non-medical: Not on file  Tobacco Use  . Smoking status: Current Every Day Smoker    Packs/day: 1.00    Types: Cigarettes  . Smokeless tobacco: Never Used  Substance and Sexual Activity  . Alcohol use:  Never    Frequency: Never  . Drug use: Never  . Sexual activity: Not on file  Lifestyle  . Physical activity:    Days per week: Not on file    Minutes per session: Not on file  . Stress: Not on file  Relationships  . Social connections:    Talks on phone: Not on file    Gets together: Not on file    Attends religious service: Not on file    Active member of club or organization: Not on file    Attends meetings of clubs or organizations: Not on file    Relationship status: Not on file  Other Topics Concern  . Not on file  Social History Narrative  . Not on file   Additional Social History: He lives alone. He is single and does not have children. 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 He denies alcohol or illicit substance use.     Allergies:  No Known Allergies  Labs:  Results for orders placed or performed during the hospital encounter of 02/19/19 (from the past 48 hour(s))  Comprehensive metabolic panel     Status: Abnormal   Collection Time: 02/19/19  8:50 PM  Result Value Ref Range   Sodium 118 (LL) 135 - 145 mmol/L    Comment: NEAL,K AT 2140 ON 030220 BY CHERESNOWSKY,T   Potassium 3.1 (L) 3.5 - 5.1 mmol/L   Chloride 87 (L) 98 - 111 mmol/L   CO2 20 (L) 22 - 32 mmol/L   Glucose, Bld 100 (H) 70 - 99 mg/dL   BUN 11 6 - 20 mg/dL   Creatinine, Ser 0.84 0.61 - 1.24 mg/dL   Calcium 8.5 (L) 8.9 - 10.3 mg/dL   Total Protein 6.8 6.5 - 8.1 g/dL   Albumin 3.9 3.5 - 5.0 g/dL   AST 32 15 - 41 U/L   ALT 33 0 - 44 U/L   Alkaline Phosphatase 75 38 - 126 U/L   Total Bilirubin 0.5 0.3 - 1.2 mg/dL   GFR calc non Af Amer >60 >60 mL/min   GFR calc Af Amer >60 >60 mL/min   Anion gap 11 5 - 15    Comment: Performed at [facility], [address] [place], [place] [address]  CBC     Status: Abnormal   Collection Time: 02/19/19  8:50 PM  Result Value Ref Range   WBC 18.3  (H) 4.0 - 10.5 K/uL   RBC 4.89 4.22 - 5.81 MIL/uL   Hemoglobin 14.9 13.0 - 17.0 g/dL   HCT 43.0 39.0 - 52.0 %   MCV 87.9 80.0 - 100.0 fL   MCH 30.5 26.0 - 34.0 pg   MCHC 34.7 30.0 - 36.0 g/dL   RDW  12.7 11.5 - 15.5 %   Platelets 283 150 - 400 K/uL   nRBC 0.0 0.0 - 0.2 %    Comment: Performed at [facility], [address] [place], [place] [address]  Ethanol     Status: None   Collection Time: 02/19/19  8:50 PM  Result Value Ref Range   Alcohol, Ethyl (B) <10 <10 mg/dL    Comment: (NOTE) Lowest detectable limit for serum alcohol is 10 mg/dL. For medical purposes only. Performed at [facility], [address] [place], [place] [address]   Rapid urine drug screen (hospital performed)     Status: None   Collection Time: 02/19/19  8:59 PM  Result Value Ref Range   Opiates NONE DETECTED NONE DETECTED   Cocaine NONE DETECTED NONE DETECTED   Benzodiazepines NONE DETECTED NONE DETECTED   Amphetamines NONE DETECTED NONE DETECTED   Tetrahydrocannabinol NONE DETECTED NONE DETECTED   Barbiturates NONE DETECTED NONE DETECTED    Comment: (NOTE) DRUG SCREEN FOR MEDICAL PURPOSES ONLY.  IF CONFIRMATION IS NEEDED FOR ANY PURPOSE, NOTIFY LAB WITHIN 5 DAYS. LOWEST DETECTABLE LIMITS FOR URINE DRUG SCREEN Drug Class                     Cutoff (ng/mL) Amphetamine and metabolites    1000 Barbiturate and metabolites    200 Benzodiazepine                 315 Tricyclics and metabolites     300 Opiates and metabolites        300 Cocaine and metabolites        300 THC                            50 Performed at [facility], [address] [place], [place] [address]   Ammonia     Status: None   Collection Time: 02/19/19  9:00 PM  Result Value Ref Range   Ammonia 18 9 - 35 umol/L    Comment: Performed at [facility], [address] [place], [place] [address]  Basic metabolic panel     Status: Abnormal   Collection Time: 02/20/19  2:09 AM   Result Value Ref Range   Sodium 126 (L) 135 - 145 mmol/L   Potassium 3.1 (L) 3.5 - 5.1 mmol/L   Chloride 93 (L) 98 - 111 mmol/L   CO2 22 22 - 32 mmol/L   Glucose, Bld 108 (H) 70 - 99 mg/dL   BUN 8 6 - 20 mg/dL   Creatinine, Ser 0.90 0.61 - 1.24 mg/dL   Calcium 8.4 (L) 8.9 - 10.3 mg/dL   GFR calc non Af Amer >60 >60 mL/min   GFR calc Af Amer >60 >60 mL/min   Anion gap 11 5 - 15    Comment: Performed at [facility], [place] [address], [place], [place] [address]  Culture, blood (Routine X 2) w Reflex to ID Panel     Status: None (Preliminary result)   Collection Time: 02/20/19  2:09 AM  Result Value Ref Range   Specimen Description      BLOOD LEFT ANTECUBITAL Performed at [facility] [address], [place], [place] [address]    Special Requests      BOTTLES DRAWN AEROBIC AND ANAEROBIC Blood Culture adequate volume Performed at [place]  [address], [place], [place] [address]    Culture      NO GROWTH 1 DAY Performed at [facility] [address] [place], [place] [address]    Report Status PENDING   Culture, blood (Routine X 2) w Reflex to ID Panel     Status: None (Preliminary result)   Collection Time: 02/20/19  2:09 AM  Result Value Ref Range   Specimen Description      BLOOD LEFT HAND Performed at [place] [address] [place], [place] [address]    Special Requests      BOTTLES DRAWN AEROBIC AND ANAEROBIC Blood Culture adequate volume Performed at [place] [facility] [address], [place], [place] [address]    Culture      NO GROWTH 1 DAY Performed at [facility] [address] [place], [place] [address]    Report Status PENDING   Osmolality     Status: Abnormal   Collection Time: 02/20/19  2:09 AM  Result Value Ref Range   Osmolality 253 (L) 275 - 295 mOsm/kg    Comment: Performed at [facility], [place] [address], [place], [place]  [address]  TSH     Status: None   Collection Time: 02/20/19  2:09 AM  Result Value Ref Range   TSH 1.207 0.350 - 4.500 uIU/mL    Comment: Performed by a 3rd Generation assay with a functional sensitivity of <=0.01 uIU/mL. Performed at [facility], [place] [address], [place], [place] [address]   MRSA PCR Screening     Status: None   Collection Time: 02/20/19  3:30 AM  Result Value Ref Range   MRSA by PCR NEGATIVE NEGATIVE    Comment:        The GeneXpert MRSA Assay (FDA approved for NASAL specimens only), is one component of a comprehensive MRSA colonization surveillance program. It is not intended to diagnose MRSA infection nor to guide or monitor treatment for MRSA infections. Performed at [facility], [place] [address] [place], [place] [address]   Urinalysis, Routine w reflex microscopic     Status: Abnormal   Collection Time: 02/20/19  5:30 AM  Result Value Ref Range   Color, Urine STRAW (A) YELLOW   APPearance CLEAR CLEAR   Specific Gravity, Urine 1.003 (L) 1.005 - 1.030   pH 7.0 5.0 - 8.0   Glucose, UA NEGATIVE NEGATIVE mg/dL   Hgb urine dipstick NEGATIVE NEGATIVE   Bilirubin Urine NEGATIVE NEGATIVE   Ketones, ur NEGATIVE NEGATIVE mg/dL   Protein, ur NEGATIVE NEGATIVE mg/dL   Nitrite NEGATIVE NEGATIVE   Leukocytes,Ua NEGATIVE NEGATIVE    Comment: Performed at [place] [address] [place], [place] [address]  Osmolality, urine     Status: Abnormal   Collection Time: 02/20/19  5:30 AM  Result Value Ref Range   Osmolality, Ur 105 (L) 300 - 900 mOsm/kg    Comment: REPEATED TO VERIFY Performed at [facility], [place] [address], [place], [place] [address]   Creatinine, urine, random     Status: None   Collection Time: 02/20/19  5:30 AM  Result Value Ref Range   Creatinine, Urine 34.23 mg/dL    Comment: Performed at [facility], [place] [address], [place], [place] [address]  Sodium, urine,  random     Status: None   Collection Time: 02/20/19  5:30 AM  Result Value Ref Range  Sodium, Ur <10 mmol/L    Comment: Performed at [facility], [place] [address], [place], [place] [address]  Basic metabolic panel     Status: Abnormal   Collection Time: 02/20/19  7:54 AM  Result Value Ref Range   Sodium 128 (L) 135 - 145 mmol/L   Potassium 3.2 (L) 3.5 - 5.1 mmol/L   Chloride 98 98 - 111 mmol/L   CO2 19 (L) 22 - 32 mmol/L   Glucose, Bld 146 (H) 70 - 99 mg/dL   BUN 9 6 - 20 mg/dL   Creatinine, Ser 0.98 0.61 - 1.24 mg/dL   Calcium 8.3 (L) 8.9 - 10.3 mg/dL   GFR calc non Af Amer >60 >60 mL/min   GFR calc Af Amer >60 >60 mL/min   Anion gap 11 5 - 15    Comment: Performed at [facility], [place] [address], [place], [place] [address]  Uric acid     Status: None   Collection Time: 02/20/19  8:54 AM  Result Value Ref Range   Uric Acid, Serum 5.4 3.7 - 8.6 mg/dL    Comment: Performed at [facility], [place] [address] [place], [place] [address]  Cortisol     Status: None   Collection Time: 02/20/19  8:54 AM  Result Value Ref Range   Cortisol, Plasma 13.3 ug/dL    Comment: (NOTE) AM    6.7 - 22.6 ug/dL PM   <10.0       ug/dL Performed at [place] [address], [place], [place] [address]   HIV antibody (Routine Testing)     Status: None   Collection Time: 02/20/19  8:54 AM  Result Value Ref Range   HIV Screen 4th Generation wRfx Non Reactive Non Reactive    Comment: (NOTE) Performed At: [facility] [place], [place] [id number] Rush Farmer MD [id number]   Brain natriuretic peptide     Status: None   Collection Time: 02/20/19  8:54 AM  Result Value Ref Range   B Natriuretic Peptide 49.0 0.0 - 100.0 pg/mL    Comment: Performed at [facility], [place] [address], [place], [place] [address]  CBC with Differential/Platelet     Status: Abnormal   Collection Time: 02/20/19   8:54 AM  Result Value Ref Range   WBC 11.0 (H) 4.0 - 10.5 K/uL   RBC 4.74 4.22 - 5.81 MIL/uL   Hemoglobin 14.7 13.0 - 17.0 g/dL   HCT 43.3 39.0 - 52.0 %   MCV 91.4 80.0 - 100.0 fL   MCH 31.0 26.0 - 34.0 pg   MCHC 33.9 30.0 - 36.0 g/dL   RDW 13.1 11.5 - 15.5 %   Platelets 269 150 - 400 K/uL   nRBC 0.0 0.0 - 0.2 %   Neutrophils Relative % 64 %   Neutro Abs 7.2 1.7 - 7.7 K/uL   Lymphocytes Relative 20 %   Lymphs Abs 2.2 0.7 - 4.0 K/uL   Monocytes Relative 12 %   Monocytes Absolute 1.4 (H) 0.1 - 1.0 K/uL   Eosinophils Relative 2 %   Eosinophils Absolute 0.2 0.0 - 0.5 K/uL   Basophils Relative 1 %   Basophils Absolute 0.1 0.0 - 0.1 K/uL   Immature Granulocytes 1 %   Abs Immature Granulocytes 0.06 0.00 - 0.07 K/uL    Comment: Performed at [facility], [place] [address] [place],  [address]  Magnesium  Status: None   Collection Time: 02/20/19  8:54 AM  Result Value Ref Range   Magnesium 2.1 1.7 - 2.4 mg/dL    Comment: Performed at [facility], [place] [address], [place], [place] [address]  Osmolality     Status: Abnormal   Collection Time: 02/20/19  8:54 AM  Result Value Ref Range   Osmolality 264 (L) 275 - 295 mOsm/kg    Comment: REPEATED TO VERIFY PERFORMED AT [facility] Performed at [facility], [place] [address] [place], [place] [address]   Basic metabolic panel     Status: Abnormal   Collection Time: 02/20/19  1:00 PM  Result Value Ref Range   Sodium 130 (L) 135 - 145 mmol/L   Potassium 4.4 3.5 - 5.1 mmol/L    Comment: DELTA CHECK NOTED REPEATED TO VERIFY NO VISIBLE HEMOLYSIS    Chloride 102 98 - 111 mmol/L   CO2 20 (L) 22 - 32 mmol/L   Glucose, Bld 87 70 - 99 mg/dL   BUN 9 6 - 20 mg/dL   Creatinine, Ser 0.95 0.61 - 1.24 mg/dL   Calcium 8.5 (L) 8.9 - 10.3 mg/dL   GFR calc non Af Amer >60 >60 mL/min   GFR calc Af Amer >60 >60 mL/min   Anion gap 8 5 - 15    Comment: Performed at [facility], [place] [address], [place], [place] [address]  Basic metabolic panel     Status: Abnormal   Collection Time: 02/20/19  7:05 PM  Result Value Ref Range   Sodium 132 (L) 135 - 145 mmol/L   Potassium 4.2 3.5 - 5.1 mmol/L   Chloride 104 98 - 111 mmol/L   CO2 22 22 - 32 mmol/L   Glucose, Bld 110 (H) 70 - 99 mg/dL   BUN 13 6 - 20 mg/dL   Creatinine, Ser 1.14 0.61 - 1.24 mg/dL   Calcium 8.6 (L) 8.9 - 10.3 mg/dL   GFR calc non Af Amer >60 >60 mL/min   GFR calc Af Amer >60 >60 mL/min   Anion gap 6 5 - 15    Comment: Performed at [facility], [place] [address] [place], [place] [address]  Basic metabolic panel     Status: Abnormal   Collection Time: 02/21/19 12:42 AM  Result Value Ref Range   Sodium 129 (L) 135 - 145 mmol/L   Potassium 4.3 3.5 - 5.1 mmol/L   Chloride 101 98 - 111 mmol/L   CO2 22 22 - 32 mmol/L   Glucose, Bld 123 (H) 70 - 99 mg/dL   BUN 17 6 - 20 mg/dL   Creatinine, Ser 1.07 0.61 - 1.24 mg/dL   Calcium 8.2 (L) 8.9 - 10.3 mg/dL   GFR calc non Af Amer >60 >60 mL/min   GFR calc Af Amer >60 >60 mL/min   Anion gap 6 5 - 15    Comment: Performed at [facility], [place] [address], [place], [place] [address]  Basic metabolic panel     Status: Abnormal   Collection Time: 02/21/19  8:27 AM  Result Value Ref Range   Sodium 130 (L) 135 - 145 mmol/L   Potassium 4.3 3.5 - 5.1 mmol/L   Chloride 102 98 - 111 mmol/L   CO2 20 (L) 22 - 32 mmol/L   Glucose, Bld 122 (H) 70 - 99 mg/dL   BUN 14 6 - 20 mg/dL   Creatinine, Ser 0.98 0.61 -  1.24 mg/dL   Calcium 8.6 (L) 8.9 - 10.3 mg/dL   GFR calc non Af Amer >60 >60 mL/min   GFR calc Af Amer >60 >60 mL/min   Anion gap 8 5 - 15    Comment: Performed at [facility], [place] [address] [place], [place] [address]    Current Facility-Administered Medications  Medication Dose Route Frequency Provider Last Rate Last Dose  . acetaminophen (TYLENOL) tablet 650 mg  650 mg Oral Q6H PRN  Lavina Hamman, MD       Or  . acetaminophen (TYLENOL) suppository 650 mg  650 mg Rectal Q6H PRN Lavina Hamman, MD      . albuterol (PROVENTIL) (2.5 MG/3ML) 0.083% nebulizer solution 2.5 mg  2.5 mg Nebulization Q6H PRN Lavina Hamman, MD      . amLODipine (NORVASC) tablet 5 mg  5 mg Oral Daily Lavina Hamman, MD   5 mg at 02/21/19 0936  . aspirin tablet 325 mg  325 mg Oral Daily Lavina Hamman, MD   325 mg at 02/21/19 6962  . Chlorhexidine Gluconate Cloth 2 % PADS 6 each  6 each Topical Daily Ivor Costa, MD      . enoxaparin (LOVENOX) injection 40 mg  40 mg Subcutaneous Q24H Lavina Hamman, MD   40 mg at 02/20/19 2136  . fluticasone (FLONASE) 50 MCG/ACT nasal spray 1 spray  1 spray Each Nare Daily Lavina Hamman, MD   1 spray at 02/21/19 [phone]  . hydrALAZINE (APRESOLINE) injection 5 mg  5 mg Intravenous Q2H PRN Ivor Costa, MD      . naphazoline-pheniramine (NAPHCON-A) 0.025-0.3 % ophthalmic solution 1 drop  1 drop Both Eyes QID PRN Lavina Hamman, MD   1 drop at 02/20/19 1727  . ondansetron (ZOFRAN) tablet 4 mg  4 mg Oral Q6H PRN Lavina Hamman, MD       Or  . ondansetron [facility]) injection 4 mg  4 mg Intravenous Q6H PRN Lavina Hamman, MD      . sodium chloride flush (NS) 0.9 % injection 3 mL  3 mL Intravenous Q12H Lavina Hamman, MD   3 mL at 02/21/19 4132    Musculoskeletal: Strength & Muscle Tone: within normal limits Gait & Station: normal Patient leans: N/A  Psychiatric Specialty Exam: Physical Exam  Nursing note and vitals reviewed. Constitutional: He is oriented to person, place, and time. He appears well-developed and well-nourished.  HENT:  Head: Normocephalic and atraumatic.  Neck: Normal range of motion.  Respiratory: Effort normal.  Musculoskeletal: Normal range of motion.  Neurological: He is alert and oriented to person, place, and time.  Psychiatric: His speech is normal and behavior is normal. His mood appears anxious. Thought content is paranoid and  delusional. Cognition and memory are impaired. He expresses impulsivity.    Review of Systems  Cardiovascular: Negative for chest pain.  Gastrointestinal: Negative for abdominal pain, nausea and vomiting.  Psychiatric/Behavioral: Positive for depression. Negative for hallucinations, substance abuse and suicidal ideas. The patient is nervous/anxious and has insomnia.   All other systems reviewed and are negative.   Blood pressure (!) 180/86, pulse 94, temperature 98.6 F (37 C), temperature source Oral, resp. rate (!) 22, height 5\' 7"  (1.702 m), weight 95.3 kg, SpO2 97 %.Body mass index is 32.89 kg/m.  General Appearance: Fairly Groomed, obese, middle aged, Caucasian male, wearing a hospital gown and pants with baseball hat who is sitting in a chair.  Eye Contact:  Good  Speech:  Clear and Coherent and Normal Rate  Volume:  Normal  Mood:  Anxious  Affect:  Congruent  Thought Process:  Goal Directed, Linear and Descriptions of Associations: Intact  Orientation:  Full (Time, Place, and Person)  Thought Content:  Logical  Suicidal Thoughts:  No  Homicidal Thoughts:  No  Memory:  Immediate;   Good Recent;   Good Remote;   Good  Judgement:  Impaired  Insight:  Lacking  Psychomotor Activity:  Normal  Concentration:  Concentration: Good and Attention Span: Good  Recall:  Good  Fund of Knowledge:  Good  Language:  Good  Akathisia:  No  Handed:  Right  AIMS (if indicated):   N/A  Assets:  Communication Skills Desire for Improvement Housing Physical Health Resilience Social Support  ADL's:  Intact  Cognition: Impaired due to altered mental status.   Sleep:   Poor   Assessment:  Sharmarke Cicio is a 63 y.o. male who was admitted with hyponatremia secondary to HCTZ. His hospital course has been complicated by acute metabolic encephalopathy which appeared to improve and patient was planned for discharge today and subsequently became paranoid. He endorses anxiety and paranoia in the  setting of multiple psychosocial stressors including losing his job and financial difficulties. He has not been sleeping or eating well. Differential diagnosis includes depression with psychosis and delirium secondary to medical condition (although patient was oriented x 3 on interview).  He denies SI, HI or AVH at this time. Recommend Zyprexa for psychosis. Patient will be observed overnight and if he does not have improvement in his mental status he may require inpatient psychiatric hospitalization for stabilization and treatment.   Treatment Plan Summary: -Start Zyprexa 5 mg qhs for psychosis and insomnia. Provide Zyprexa 2.5 mg as a one time dose for anxiety and paranoia.  -EKG reviewed and QTc 455 on 3/3. Please closely monitor when starting or increasing QTc prolonging agents.  -Recommend observing overnight and if no improvement then patient may require inpatient psychiatric hospitalization for stabilization and treatment.  -Psychiatry will follow as needed.    Disposition: Overnight observation to determine disposition.  Faythe Dingwall, DO 02/21/2019 11:52 AM

## 2019-02-21 NOTE — Progress Notes (Addendum)
PROGRESS NOTE    Francis Soto  QIH:[id number] DOB: 12-Oct-1956 DOA: 02/19/2019 PCP: No primary care provider on file.   Brief Narrative: Francis Soto is a 63 y.o. male with a history of hypertension, tobacco use. Patient presented secondary to confusion and was found to have a sodium of 118. Presentation complicated by insomnia as well.    Assessment & Plan:   Principal Problem:   Hyponatremia Active Problems:   Smoker   Hypokalemia   Metabolic acidosis, normal anion gap (NAG)   Acute metabolic encephalopathy   Leucocytosis   Insomnia   Hyponatremia Likely secondary to hydrochlorothiazide. Improved. Discontinue hydrochlorothiazide on discharge. Recheck BMP in one week with PCP.  Paranoia Acute change. Patient is very paranoid that people are out to take him somewhere. 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 Episode started after patient came out of the restroom and saw all the monitors in his room were off. Started on nicotine patch which is unlikely to have caused this since patient smokes 2 PPD. UDS negative on admission. -Psychiatry consult: Recommending starting Zyprexa; 2.5 mg now followed by 5 mg qhs  Metabolic acidosis Improved.  Essential hypertension Uncontrolled secondary to holding home medications -Continue home amlodipine 5 mg and restart home lisinopril  Insomnia Likely contributing to above problem. -Psych recommendations  Hypokalemia In setting of hydrochlorothiazide use. Resolved with supplementation.  Tobacco abuse Patient smokes 2.5 PPD. Was started on 21 mg nicotine patch -Hold for now in setting of paranoia   DVT prophylaxis: Lovenox Code Status:   Code Status: Full Code Family Communication: Sister at bedside Disposition Plan: Discharge to behavioral health if a bed is available today. Medically stable for discharge, but unsafe secondary to acute behavioral change   Consultants:    Psychiatry  Procedures:   None  Antimicrobials:  None    Subjective: Patient on initial examination reported no issues. Upon reevaluation secondary to page from nurse, patient is very paranoid and thinks people are coming to get him. Symptoms started after patient came out of the restroom. Symptoms are worsening per nurse/sister. Patient also convinced that he was given Valium which was not the case.  Objective: Vitals:   02/21/19 0950 02/21/19 1000 02/21/19 1204 02/21/19 1215  BP:   (!) 183/90 (!) 184/101  Pulse: 94  (!) 118 (!) 104  Resp: (!) 22 (!) 26  (!) 25  Temp:      TempSrc:      SpO2: 97%   100%  Weight:      Height:        Intake/Output Summary (Last 24 hours) at 02/21/2019 1327 Last data filed at 02/21/2019 0700 Gross per 24 hour  Intake 814.78 ml  Output 1200 ml  Net -385.22 ml   Filed Weights   02/19/19 1911  Weight: 95.3 kg    Examination:  General exam: Appears calm and comfortable Respiratory system: Clear to auscultation. Respiratory effort normal. Cardiovascular system: S1 & S2 heard, RRR. 2/6 systolic murmur Gastrointestinal system: Abdomen is nondistended, soft and nontender. No organomegaly or masses felt. Normal bowel sounds heard. Central nervous system: Alert and oriented. No focal neurological deficits. Extremities: No edema. No calf tenderness Skin: No cyanosis. No rashes Psychiatry: Judgement and insight appear impaired. Paranoid.    Data Reviewed: I have personally reviewed following labs and imaging studies  CBC: Recent Labs  Lab 02/19/19 2050 02/20/19 0854  WBC 18.3* 11.0*  NEUTROABS  --  7.2  HGB 14.9 14.7  HCT  43.0 43.3  MCV 87.9 91.4  PLT 283 449   Basic Metabolic Panel: Recent Labs  Lab 02/20/19 0754 02/20/19 0854 02/20/19 1300 02/20/19 1905 02/21/19 0042 02/21/19 0827  NA 128*  --  130* 132* 129* 130*  K 3.2*  --  4.4 4.2 4.3 4.3  CL 98  --  102 104 101 102  CO2 19*  --  20* 22 22 20*  GLUCOSE 146*  --   87 110* 123* 122*  BUN 9  --  9 13 17 14   CREATININE 0.98  --  0.95 1.14 1.07 0.98  CALCIUM 8.3*  --  8.5* 8.6* 8.2* 8.6*  MG  --  2.1  --   --   --   --    GFR: Estimated Creatinine Clearance: 88.2 mL/min (by C-G formula based on SCr of 0.98 mg/dL). Liver Function Tests: Recent Labs  Lab 02/19/19 2050  AST 32  ALT 33  ALKPHOS 75  BILITOT 0.5  PROT 6.8  ALBUMIN 3.9   No results for input(s): LIPASE, AMYLASE in the last 168 hours. Recent Labs  Lab 02/19/19 2100  AMMONIA 18   Coagulation Profile: No results for input(s): INR, PROTIME in the last 168 hours. Cardiac Enzymes: No results for input(s): CKTOTAL, CKMB, CKMBINDEX, TROPONINI in the last 168 hours. BNP (last 3 results) No results for input(s): PROBNP in the last 8760 hours. HbA1C: No results for input(s): HGBA1C in the last 72 hours. CBG: No results for input(s): GLUCAP in the last 168 hours. Lipid Profile: No results for input(s): CHOL, HDL, LDLCALC, TRIG, CHOLHDL, LDLDIRECT in the last 72 hours. Thyroid Function Tests: Recent Labs    02/20/19 0209  TSH 1.207   Anemia Panel: No results for input(s): VITAMINB12, FOLATE, FERRITIN, TIBC, IRON, RETICCTPCT in the last 72 hours. Sepsis Labs: No results for input(s): PROCALCITON, LATICACIDVEN in the last 168 hours.  Recent Results (from the past 240 hour(s))  Culture, blood (Routine X 2) w Reflex to ID Panel     Status: None (Preliminary result)   Collection Time: 02/20/19  2:09 AM  Result Value Ref Range Status   Specimen Description   Final    BLOOD LEFT ANTECUBITAL Performed at [facility] [address] [place], [place] [address]    Special Requests   Final    BOTTLES DRAWN AEROBIC AND ANAEROBIC Blood Culture adequate volume Performed at [facility] [address], [place], [place] [address]    Culture   Final    NO GROWTH 1 DAY Performed at [facility], [place] [address] [place], [place] [address]     Report Status PENDING  Incomplete  Culture, blood (Routine X 2) w Reflex to ID Panel     Status: None (Preliminary result)   Collection Time: 02/20/19  2:09 AM  Result Value Ref Range Status   Specimen Description   Final    BLOOD LEFT HAND Performed at [facility] [address], [place], [place] [address]    Special Requests   Final    BOTTLES DRAWN AEROBIC AND ANAEROBIC Blood Culture adequate volume Performed at [facility] [address], [place], [place] [address]    Culture   Final    NO GROWTH 1 DAY Performed at [facility], [place] [address], [place], [place] [address]    Report Status PENDING  Incomplete  MRSA PCR Screening     Status: None   Collection  Time: 02/20/19  3:30 AM  Result Value Ref Range Status   MRSA by PCR NEGATIVE NEGATIVE Final    Comment:        The GeneXpert MRSA Assay (FDA approved for NASAL specimens only), is one component of a comprehensive MRSA colonization surveillance program. It is not intended to diagnose MRSA infection nor to guide or monitor treatment for MRSA infections. Performed at [facility], [place] [address], [place], [place] [address]          Radiology Studies: Dg Chest Port 1 View  Result Date: 02/20/2019 CLINICAL DATA:  SOB, smoker. EXAM: PORTABLE CHEST 1 VIEW COMPARISON:  None. FINDINGS: Normal mediastinum and cardiac silhouette. Normal pulmonary vasculature. No evidence of effusion, infiltrate, or pneumothorax. No acute bony abnormality. Lordotic positioning. IMPRESSION: No acute cardiopulmonary process. Electronically Signed   By: Suzy Bouchard M.D.   On: 02/20/2019 11:52        Scheduled Meds: . amLODipine  5 mg Oral Daily  . aspirin  325 mg Oral Daily  . Chlorhexidine Gluconate Cloth  6 each Topical Daily  . enoxaparin (LOVENOX) injection  40 mg Subcutaneous Q24H  . fluticasone  1 spray Each Nare Daily  . lisinopril  20 mg Oral Daily  . [START  ON 02/22/2019] OLANZapine  5 mg Oral QHS   Followed by  . OLANZapine  2.5 mg Oral Once  . sodium chloride flush  3 mL Intravenous Q12H   Continuous Infusions:   LOS: 2 days     Cordelia Poche, MD [facility] 02/21/2019, 1:27 PM  If 7PM-7AM, please contact night-coverage [email or web address]

## 2019-02-21 NOTE — Discharge Summary (Addendum)
Physician Discharge Summary  Francis Soto ACZ:[id number] DOB: 1956/10/15 DOA: 02/19/2019  PCP: No primary care provider on file.  Admit date: 02/19/2019 Discharge date: 02/21/2019  Admitted From: Home Disposition: Home  Recommendations for Outpatient Follow-up:  1. Follow up with PCP in 1 week 2. Discontinue hydrochlorothiazide 3. Behavioral health follow-up 4. Please obtain BMP/CBC in one week 5. Please follow up on the following pending results: None  Home Health: None Equipment/Devices: None  Discharge Condition: Stable CODE STATUS: Full code Diet recommendation: Heart healthy   Brief/Interim Summary:  Admission HPI written by Ivor Costa, MD   Chief Complaint: confusion  HPI: Francis Soto is a 63 y.o. male with Past medical history of hypertension, active smoker. The patient presents with complaints of confusion. Patient was brought in by family members as the patient was noted to have some short-term memory loss, was unable to follow command and was also not speaking coherently. 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 Patient is also sleeping less with 1 to 2 hours of sleep at a stretch. Denies any weight loss. Denies any nausea or vomiting.  No fever no chills.  No chest pain.  Abdominal pain.  No dizziness lightheadedness.  No diarrhea no constipation.  No burning urination. Denies any alcohol abuse.  No other substance abuse. He takes 6 aspirin full dose a day for generalized body ache and knee pain.  No injury reported as well.   Smokes 2-1/2 pack a day, currently willing to quit. He reports that he does not go to see his doctor on a frequent basis. He has a ventral hernia which she reports has been present for a while, in the beginning it was reducible but since last few years it is not reducible but does not have any  pain.  ED Course: Presents with confusion brought in by family, found to have sodium of 118, given IV hydration and was referred for admission.   Hospital course:  Hyponatremia Likely secondary to hydrochlorothiazide. Improved. Discontinue hydrochlorothiazide on discharge. Recheck BMP in one week with PCP.  Acute metabolic encephalopathy Secondary to hyponatremia. Resolved.  Paranoia Acute change on 3/4. Patient is very paranoid that people are out to take him somewhere. 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 Episode started after patient came out of the restroom and saw all the monitors in his room were off. Started on nicotine patch which is unlikely to have caused this since patient smokes 2 PPD. UDS negative on admission. Psychiatry consulted and recommended Zyprexa. Paranoia improved. Patient will need outpatient behavioral health follow-up. Zyprexa 5 mg qhs on discharge  Metabolic acidosis Improved.  Essential hypertension Uncontrolled. Discontinuing hydrochlorothiazide. Increase amlodipine to 10 mg daily and continue lisinopril 20 mg daily.  Insomnia Likely contributing to above problem. Psychiatry consulted. Zyprexa as mentioned above.  Hypokalemia In setting of hydrochlorothiazide use. Resolved with supplementation.  Tobacco abuse Patient smokes 2.5 PPD. Was started on 21 mg nicotine patch. Discharge on 14 mg patch. Discontinue if paranoia returns.  Discharge Diagnoses:  Principal Problem:   Hyponatremia Active Problems:   Smoker   Hypokalemia   Metabolic acidosis, normal anion gap (NAG)   Acute metabolic encephalopathy   Leucocytosis   Insomnia    Discharge Instructions   Allergies as of 02/22/2019   No Known Allergies  Medication List    STOP taking these medications   lisinopril-hydrochlorothiazide 20-25 MG tablet Commonly known as:  PRINZIDE,ZESTORETIC     TAKE these medications   amLODipine 10 MG  tablet Commonly known as:  NORVASC Take 1 tablet (10 mg total) by mouth daily.   aspirin 325 MG tablet Take 1 tablet (325 mg total) by mouth daily. What changed:    how much to take  when to take this   fluticasone 50 MCG/ACT nasal spray Commonly known as:  FLONASE Place 1 spray into both nostrils daily.   lisinopril 20 MG tablet Commonly known as:  PRINIVIL,ZESTRIL Take 1 tablet (20 mg total) by mouth daily.   nicotine 14 mg/24hr patch Commonly known as:  NICODERM CQ - dosed in mg/24 hours Place 1 patch (14 mg total) onto the skin daily.   OLANZapine 5 MG tablet Commonly known as:  ZYPREXA Take 1 tablet (5 mg total) by mouth at bedtime.       No Known Allergies  Consultations:  Psychiatry   Procedures/Studies: Dg Chest Port 1 View  Result Date: 02/20/2019 CLINICAL DATA:  SOB, smoker. EXAM: PORTABLE CHEST 1 VIEW COMPARISON:  None. FINDINGS: Normal mediastinum and cardiac silhouette. Normal pulmonary vasculature. No evidence of effusion, infiltrate, or pneumothorax. No acute bony abnormality. Lordotic positioning. IMPRESSION: No acute cardiopulmonary process. Electronically Signed   By: Suzy Bouchard M.D.   On: 02/20/2019 11:52      Subjective: No issues today.  Discharge Exam: Vitals:   02/20/19 2337 02/21/19 0342  BP:    Pulse:    Resp:    Temp: 98.4 F (36.9 C) 98.3 F (36.8 C)  SpO2:     Vitals:   02/20/19 2042 02/20/19 2100 02/20/19 2337 02/21/19 0342  BP:  (!) 163/74    Pulse:  94    Resp:      Temp: 98.6 F (37 C)  98.4 F (36.9 C) 98.3 F (36.8 C)  TempSrc: Oral  Axillary Axillary  SpO2:  97%    Weight:      Height:        General: Pt is alert, awake, not in acute distress   The results of significant diagnostics from this hospitalization (including imaging, microbiology, ancillary and laboratory) are listed below for reference.     Microbiology: Recent Results (from the past 240 hour(s))  MRSA PCR Screening     Status: None    Collection Time: 02/20/19  3:30 AM  Result Value Ref Range Status   MRSA by PCR NEGATIVE NEGATIVE Final    Comment:        The GeneXpert MRSA Assay (FDA approved for NASAL specimens only), is one component of a comprehensive MRSA colonization surveillance program. It is not intended to diagnose MRSA infection nor to guide or monitor treatment for MRSA infections. Performed at [facility], [place] [address], [place], [place] [address]      Labs: BNP (last 3 results) Recent Labs    02/20/19 0854  BNP 37.1   Basic Metabolic Panel: Recent Labs  Lab 02/20/19 0209 02/20/19 0754 02/20/19 0854 02/20/19 1300 02/20/19 1905 02/21/19 0042  NA 126* 128*  --  130* 132* 129*  K 3.1* 3.2*  --  4.4 4.2 4.3  CL 93* 98  --  102 104 101  CO2 22 19*  --  20* 22 22  GLUCOSE 108* 146*  --  87 110* 123*  BUN 8 9  --  9 13 17  CREATININE 0.90 0.98  --  0.95 1.14 1.07  CALCIUM 8.4* 8.3*  --  8.5* 8.6* 8.2*  MG  --   --  2.1  --   --   --    Liver Function Tests: Recent Labs  Lab 02/19/19 2050  AST 32  ALT 33  ALKPHOS 75  BILITOT 0.5  PROT 6.8  ALBUMIN 3.9   No results for input(s): LIPASE, AMYLASE in the last 168 hours. Recent Labs  Lab 02/19/19 2100  AMMONIA 18   CBC: Recent Labs  Lab 02/19/19 2050 02/20/19 0854  WBC 18.3* 11.0*  NEUTROABS  --  7.2  HGB 14.9 14.7  HCT 43.0 43.3  MCV 87.9 91.4  PLT 283 269   Cardiac Enzymes: No results for input(s): CKTOTAL, CKMB, CKMBINDEX, TROPONINI in the last 168 hours. BNP: Invalid input(s): POCBNP CBG: No results for input(s): GLUCAP in the last 168 hours. D-Dimer No results for input(s): DDIMER in the last 72 hours. Hgb A1c No results for input(s): HGBA1C in the last 72 hours. Lipid Profile No results for input(s): CHOL, HDL, LDLCALC, TRIG, CHOLHDL, LDLDIRECT in the last 72 hours. Thyroid function studies Recent Labs    02/20/19 0209  TSH 1.207   Anemia work up No results for input(s):  VITAMINB12, FOLATE, FERRITIN, TIBC, IRON, RETICCTPCT in the last 72 hours. Urinalysis    Component Value Date/Time   COLORURINE STRAW (A) 02/20/2019 0530   APPEARANCEUR CLEAR 02/20/2019 0530   LABSPEC 1.003 (L) 02/20/2019 0530   PHURINE 7.0 02/20/2019 0530   GLUCOSEU NEGATIVE 02/20/2019 0530   HGBUR NEGATIVE 02/20/2019 0530   BILIRUBINUR NEGATIVE 02/20/2019 0530   KETONESUR NEGATIVE 02/20/2019 0530   PROTEINUR NEGATIVE 02/20/2019 0530   NITRITE NEGATIVE 02/20/2019 0530   LEUKOCYTESUR NEGATIVE 02/20/2019 0530   Sepsis Labs Invalid input(s): PROCALCITONIN,  WBC,  LACTICIDVEN Microbiology Recent Results (from the past 240 hour(s))  MRSA PCR Screening     Status: None   Collection Time: 02/20/19  3:30 AM  Result Value Ref Range Status   MRSA by PCR NEGATIVE NEGATIVE Final    Comment:        The GeneXpert MRSA Assay (FDA approved for NASAL specimens only), is one component of a comprehensive MRSA colonization surveillance program. It is not intended to diagnose MRSA infection nor to guide or monitor treatment for MRSA infections. Performed at [facility], [place] [address], [place], [place] [address]     SIGNED:   Cordelia Poche, MD [facility] 02/21/2019, 7:32 AM

## 2019-02-21 NOTE — Progress Notes (Signed)
Patient refusing heart monitor and hospital gown, Patient pacing in hallway fully dressed very paranoid about computers at nurses station and the "lawyers coming for him." His sister at his bedside states due to his recent traumas of job loss and other stressors he's being unreasonable and paranoid. Patient stating it is illegal to take him unable to get him to explain what he means.Francis Soto initially agreed to IV hydralazine for elevated BP however after attaching the luer lock to his IV patietn ripped syringe off of IV. After talking with patient and hearing out his concerns about unit and life stressors, patient deescalated and eventually placed in hospital gown and telemetry. Hydralazine given as ordered. Patient continues to think the hospital unit has been taken over by law enforcement and is paranoid about visitors and staff in hallway.

## 2019-02-21 NOTE — Progress Notes (Signed)
Patient has been exhibiting paranoid behaviors and thoughts throughout the day today. Patient refusing to be connected to the telemetry box or have BP taken, stating "I don't feel that's necessary right now". Sister at bedside, she says that this is not his normal behavior. Dr.Nettey aware and consulted Psych.

## 2019-02-22 MED ORDER — ASPIRIN 325 MG PO TABS: 325.0000 mg | ORAL_TABLET | Freq: Every day | ORAL | Status: DC

## 2019-02-22 MED ORDER — LISINOPRIL 20 MG PO TABS: 20.0000 mg | ORAL_TABLET | Freq: Every day | ORAL | 0 refills | Status: DC

## 2019-02-22 MED ORDER — AMLODIPINE BESYLATE 10 MG PO TABS
10.0000 mg | ORAL_TABLET | Freq: Every day | ORAL | Status: DC
  Administered 2019-02-22: 10 mg via ORAL
  Filled 2019-02-22: qty 1

## 2019-02-22 MED ORDER — NICOTINE 14 MG/24HR TD PT24: 14.0000 mg | MEDICATED_PATCH | Freq: Every day | TRANSDERMAL | 0 refills | Status: DC

## 2019-02-22 MED ORDER — OLANZAPINE 5 MG PO TABS: 5.0000 mg | ORAL_TABLET | Freq: Every day | ORAL | 0 refills | Status: DC

## 2019-02-22 MED ORDER — AMLODIPINE BESYLATE 10 MG PO TABS: 10.0000 mg | ORAL_TABLET | Freq: Every day | ORAL | 0 refills | Status: DC

## 2019-02-22 MED ORDER — NICOTINE 14 MG/24HR TD PT24
14.0000 mg | MEDICATED_PATCH | Freq: Once | TRANSDERMAL | Status: DC
  Administered 2019-02-22: 14 mg via TRANSDERMAL
  Filled 2019-02-22: qty 1

## 2019-02-22 NOTE — Progress Notes (Signed)
Clinical Social Worker received consult to assist patient with outpatient psychiatric follow up. CSW gave patient a packet of resources that consist of several phychiatric clinics in the area. Patient stated  He will research clinics and follow up with them after discharge. CSW signing off as social work intervention has been met.   Rhea Pink, MSW,  [place]

## 2019-02-22 NOTE — Care Management Note (Signed)
Case Management Note  Patient Details  Name: Francis Soto MRN: [id number] Date of Birth: 1956/05/31  Subjective/Objective: AMS. From home. Support of sister. No health insurance,or pcp. Patient states has COBRA insurance-but currently not showing up in our system-informed patient that it may be retroactively applied to his bill once insurance has been active.Provided w/pcp listing-encouraged [place] clinics-patient will call on own-informed he may use the [facility] pharmacy for meds once appt set. No further CM needs.                   Action/Plan:dc home.   Expected Discharge Date:                  Expected Discharge Plan:  Home/Self Care  In-House Referral:     Discharge planning Services  CM Consult, [facility]  Post Acute Care Choice:    Choice offered to:     DME Arranged:    DME Agency:     HH Arranged:    [place] Agency:     Status of Service:  Completed, signed off  If discussed at [employer] of Stay Meetings, dates discussed:    Additional Comments:  Dessa Phi, RN 02/22/2019, 12:33 PM

## 2019-02-22 NOTE — Discharge Instructions (Signed)
Roger Kill,  You were in the hospital because of low sodium likely because of hydrochlorothiazide in addition to poor eating. Your sodium improved and you will need to have this rechecked within a week to make sure it remains stable. You also developed some paranoid thoughts. You have been started on a new medication called Zyprexa. Please follow-up with your physician or a Psychiatrist for continued management and hopeful cessation of this medication.

## 2019-02-25 LAB — CULTURE, BLOOD (ROUTINE X 2)
Culture: NO GROWTH
Culture: NO GROWTH
Special Requests: ADEQUATE
Special Requests: ADEQUATE

## 2020-05-10 IMAGING — DX DG CHEST 1V PORT
1 series · 1 of 1 positions shown · non-contrast
Comparison: None.

CLINICAL DATA: SOB, smoker.

EXAM:
PORTABLE CHEST 1 VIEW

[chest ap]
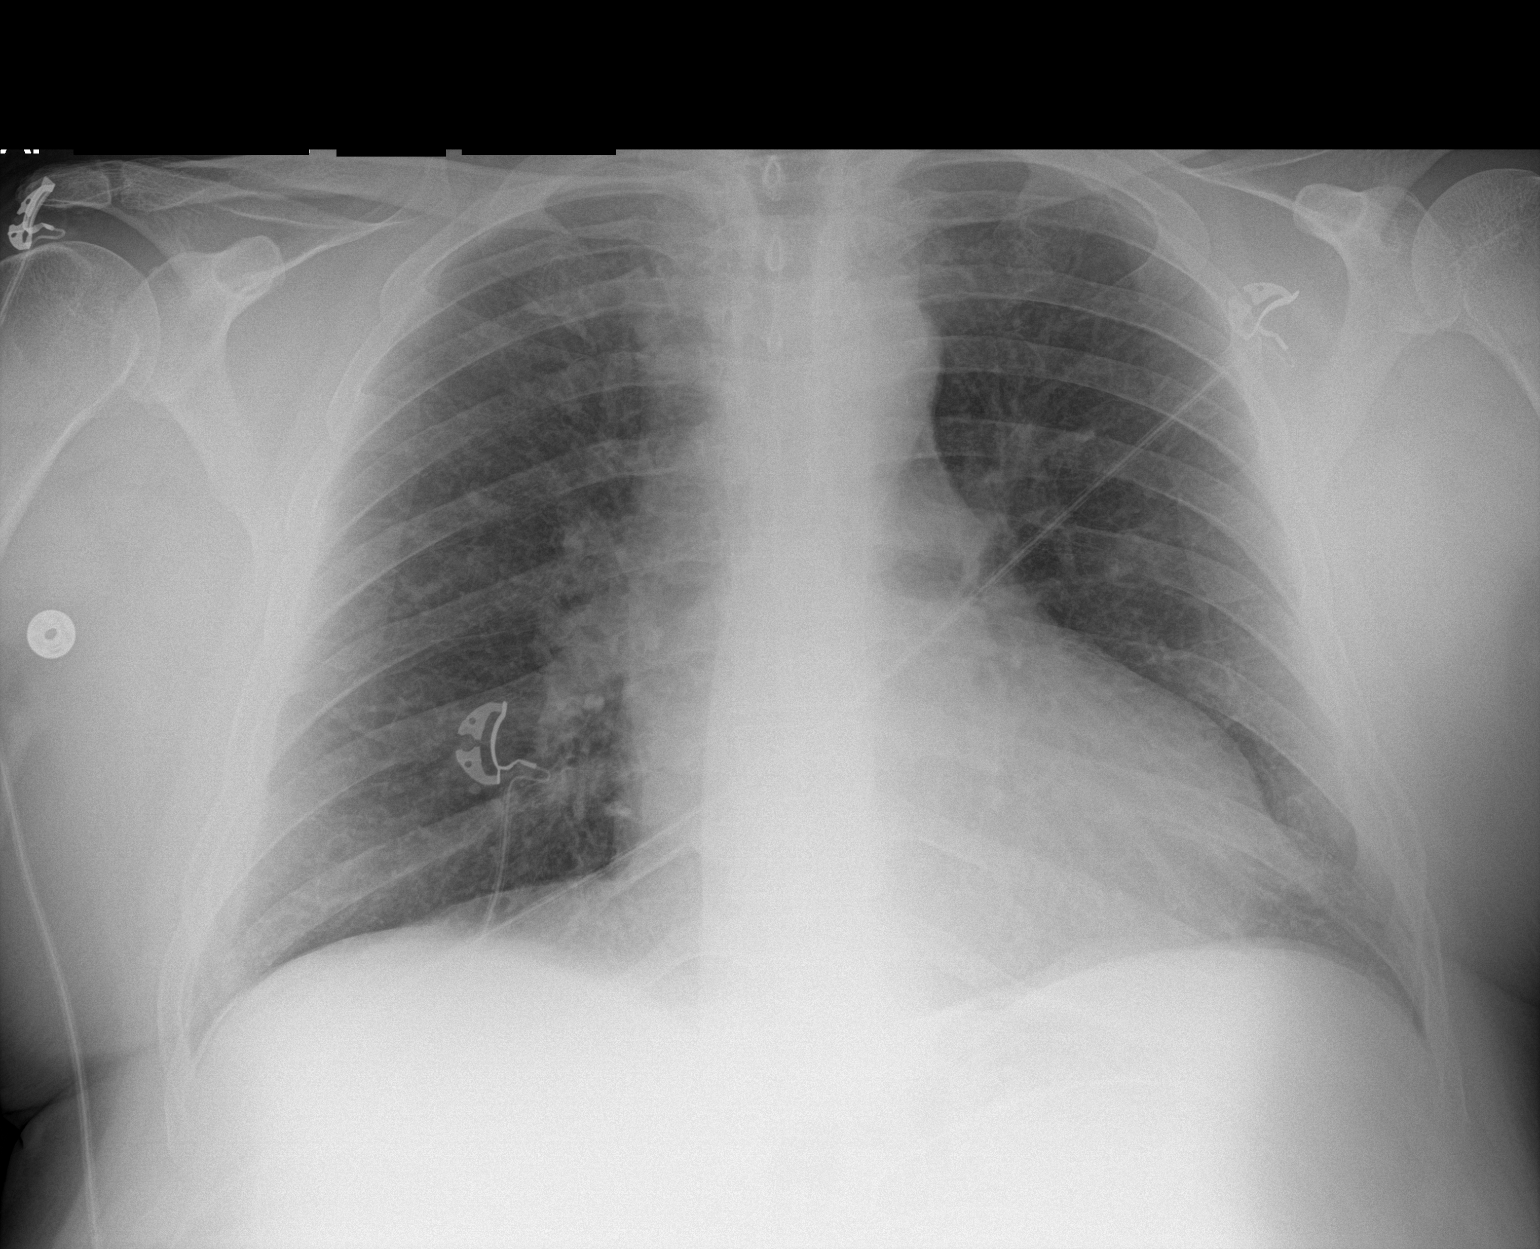

[1 of 1 positions shown; findings below may reference images not displayed]

FINDINGS: Normal mediastinum and cardiac silhouette. Normal pulmonary
vasculature. No evidence of effusion, infiltrate, or pneumothorax.
No acute bony abnormality.

Lordotic positioning.
IMPRESSION: No acute cardiopulmonary process.

## 2022-05-27 ENCOUNTER — Ambulatory Visit (INDEPENDENT_AMBULATORY_CARE_PROVIDER_SITE_OTHER): Payer: Self-pay | Admitting: Family Medicine

## 2022-05-27 ENCOUNTER — Encounter: Payer: Self-pay | Admitting: Family Medicine

## 2022-05-27 VITALS — BP 135/85 | HR 90 | Temp 97.3°F | Ht 65.5 in | Wt 216.2 lb

## 2022-05-27 DIAGNOSIS — Z6835 Body mass index (BMI) 35.0-35.9, adult: Secondary | ICD-10-CM

## 2022-05-27 DIAGNOSIS — F17209 Nicotine dependence, unspecified, with unspecified nicotine-induced disorders: Secondary | ICD-10-CM

## 2022-05-27 DIAGNOSIS — J310 Chronic rhinitis: Secondary | ICD-10-CM

## 2022-05-27 DIAGNOSIS — E6609 Other obesity due to excess calories: Secondary | ICD-10-CM

## 2022-05-27 DIAGNOSIS — K429 Umbilical hernia without obstruction or gangrene: Secondary | ICD-10-CM

## 2022-05-27 DIAGNOSIS — H6993 Unspecified Eustachian tube disorder, bilateral: Secondary | ICD-10-CM

## 2022-05-27 DIAGNOSIS — I10 Essential (primary) hypertension: Secondary | ICD-10-CM

## 2022-05-27 DIAGNOSIS — E66812 Obesity, class 2: Secondary | ICD-10-CM

## 2022-05-27 DIAGNOSIS — E871 Hypo-osmolality and hyponatremia: Secondary | ICD-10-CM

## 2022-05-27 DIAGNOSIS — H6983 Other specified disorders of Eustachian tube, bilateral: Secondary | ICD-10-CM

## 2022-05-27 MED ORDER — AMLODIPINE BESYLATE 10 MG PO TABS: 10.0000 mg | ORAL_TABLET | Freq: Every day | ORAL | 0 refills | Status: DC

## 2022-05-27 MED ORDER — LISINOPRIL 20 MG PO TABS: 20.0000 mg | ORAL_TABLET | Freq: Every day | ORAL | 0 refills | Status: DC

## 2022-05-27 NOTE — Assessment & Plan Note (Signed)
Recommend diet and exercise  

## 2022-05-27 NOTE — Assessment & Plan Note (Signed)
In office blood pressure was elevated 160/90, however patient reports home numbers in the 130s over 80s. Patient asymptomatic Refilled amlodipine 10 mg and lisinopril 20 mg daily Follow-up in 1 month for restratification labs CMP, lipid, hemoglobin A1c, UA

## 2022-05-27 NOTE — Assessment & Plan Note (Signed)
Improved on Flonase

## 2022-05-27 NOTE — Assessment & Plan Note (Signed)
Has followed with ENT in the past Improved with daily Flonase Continue monitor

## 2022-05-27 NOTE — Progress Notes (Signed)
New Patient Office Visit  Subjective    Patient ID: Francis Soto, male    DOB: 03/20/1956  Age: 66 y.o. MRN: [id number]  CC:  Chief Complaint  Patient presents with   Establish Care    Np est care would like refill on HTN and labs    HPI Francis Soto presents to establish care.  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  Luz Lex a lot for his job.  Patient with history of hypertension.  Well-controlled on amlodipine and lisinopril.  Denies chest pain, shortness of breath, blurred vision, headache, lower extremity swelling  Remote history of hyponatremia secondary to hydrochlorothiazide for hypertension.  This was approximately 3 years ago.  Patient was hospitalized with this with sodium and CMP down to 118, trending these over 24 hours did improve to 130.  We will also associated with some lethargy and paranoia.  This resolved 24 hours status post fluid resuscitation.   Patient is smoking for several years, he has been cutting back.  Patient has history of chronic rhinitis, improved with Flonase.  Did to see if ENT on 04/2022, did not feel that patient got steroid injection and was told to continue Flonase and antihistamines.  Reports that this is doing better.  Labs other than this from previous provider seen in [place], only showed CBC with mild leukocytosis in 2017.  Other notes and late December 2023 showed patient had a history of COVID and was put on Paxil for it.  Patient reports no ongoing issues with this.  Patient presents with umbilical hernia.  He had several years, denies any abdominal pain with this.  He says that he is able to reduce it without any problem.   Outpatient Encounter Medications as of 05/27/2022  Medication Sig   aspirin 325 MG tablet Take 1 tablet (325 mg total) by mouth daily.   fluticasone (FLONASE) 50 MCG/ACT nasal spray Place 1 spray into both nostrils daily.    [DISCONTINUED] amLODipine (NORVASC)  10 MG tablet Take 1 tablet (10 mg total) by mouth daily.   [DISCONTINUED] lisinopril (PRINIVIL,ZESTRIL) 20 MG tablet Take 1 tablet (20 mg total) by mouth daily.   amLODipine (NORVASC) 10 MG tablet Take 1 tablet (10 mg total) by mouth daily.   lisinopril (ZESTRIL) 20 MG tablet Take 1 tablet (20 mg total) by mouth daily.   nicotine (NICODERM CQ - DOSED IN MG/24 HOURS) 14 mg/24hr patch Place 1 patch (14 mg total) onto the skin daily. (Patient not taking: Reported on 05/27/2022)   [DISCONTINUED] OLANZapine (ZYPREXA) 5 MG tablet Take 1 tablet (5 mg total) by mouth at bedtime.   No facility-administered encounter medications on file as of 05/27/2022.    Past Medical History:  Diagnosis Date   Abnormal EKG 03/28/8118   Acute metabolic encephalopathy 12/24/7827   Altered mental status    Hypertension    Hypokalemia 02/20/2019   Hyponatremia    Metabolic acidosis, normal anion gap (NAG) 02/20/2019    History reviewed. No pertinent surgical history.  History reviewed. No pertinent family history.  Social History   Socioeconomic History   Marital status: Single    Spouse name: Not on file   Number of children: Not on file   Years of education: Not on file   Highest education level: Not on file  Occupational History   Not on file  Tobacco Use   Smoking status: Every Day    Packs/day: 0.50    Years:  25.00    Total pack years: 12.50    Types: Cigarettes   Smokeless tobacco: Never  Vaping Use   Vaping Use: Never used  Substance and Sexual Activity   Alcohol use: Yes    Comment: occ   Drug use: Never   Sexual activity: Not Currently  Other Topics Concern   Not on file  Social History Narrative   Not on file   Social Determinants of Health   Financial Resource Strain: Not on file  Food Insecurity: Not on file  Transportation Needs: Not on file  Physical Activity: Not on file  Stress: Not on file  Social Connections: Not on file  Intimate Partner Violence: Not on file    ROS As  per HPI     Objective    BP 135/85 (BP Location: Left Arm, Patient Position: Sitting, Cuff Size: Normal) Comment: home numbers  Pulse 90   Temp (!) 97.3 F (36.3 C) (Temporal)   Ht 5' 5.5" (1.664 m)   Wt 216 lb 3.2 oz (98.1 kg)   SpO2 95%   BMI 35.43 kg/m   Gen: NAD, resting comfortably CV: RRR with no murmurs appreciated Pulm: NWOB, CTAB with no crackles, wheezes, or rhonchi GI: Normal bowel sounds present. Soft, Nontender, 4 inch diameter umbilical hernia, easily reducible, no tenderness MSK: no edema, cyanosis, or clubbing noted Skin: warm, dry Neuro: grossly normal, moves all extremities Psych: Normal affect and thought content   Last CBC Lab Results  Component Value Date   WBC 11.0 (H) 02/20/2019   HGB 14.7 02/20/2019   HCT 43.3 02/20/2019   MCV 91.4 02/20/2019   MCH 31.0 02/20/2019   RDW 13.1 02/20/2019   PLT 269 61/60/7371   Last metabolic panel Lab Results  Component Value Date   GLUCOSE 122 (H) 02/21/2019   NA 130 (L) 02/21/2019   K 4.3 02/21/2019   CL 102 02/21/2019   CO2 20 (L) 02/21/2019   BUN 14 02/21/2019   CREATININE 0.98 02/21/2019   GFRNONAA >60 02/21/2019   CALCIUM 8.6 (L) 02/21/2019   PROT 6.8 02/19/2019   ALBUMIN 3.9 02/19/2019   BILITOT 0.5 02/19/2019   ALKPHOS 75 02/19/2019   AST 32 02/19/2019   ALT 33 02/19/2019   ANIONGAP 8 02/21/2019   Last lipids No results found for: "CHOL", "HDL", "LDLCALC", "LDLDIRECT", "TRIG", "CHOLHDL" Last hemoglobin A1c No results found for: "HGBA1C" Last thyroid functions Lab Results  Component Value Date   TSH 1.207 02/20/2019   Last vitamin D No results found for: "25OHVITD2", "25OHVITD3", "VD25OH" Last vitamin B12 and Folate No results found for: "VITAMINB12", "FOLATE"      Assessment & Plan:  Spent 45 minutes combination reviewing charts and discussing ongoing care with patient. Problem List Items Addressed This Visit       Cardiovascular and Mediastinum   Essential hypertension -  Primary    In office blood pressure was elevated 160/90, however patient reports home numbers in the 130s over 80s. Patient asymptomatic Refilled amlodipine 10 mg and lisinopril 20 mg daily Follow-up in 1 month for restratification labs CMP, lipid, hemoglobin A1c, UA      Relevant Medications   lisinopril (ZESTRIL) 20 MG tablet   amLODipine (NORVASC) 10 MG tablet     Respiratory   Chronic rhinitis    Has followed with ENT in the past Improved with daily Flonase Continue monitor        Nervous and Auditory   Dysfunction of both eustachian tubes  Improved on Flonase        Other   Hyponatremia    Seen on previous BMP few years ago, was symptomatic at the time Reports no ongoing symptoms status post DC of hydrochlorothiazide Repeat labs in 1 month      Class 2 obesity without serious comorbidity with body mass index (BMI) of 35.0 to 35.9 in adult    Recommend diet and exercise      Tobacco use disorder, continuous    Recommend cessation, patient has been trying to reduce      Umbilical hernia without obstruction and without gangrene    Stable Easily reducible Discussed return precautions for abdominal pain  Consider further treatment such as referral to to surgery at follow-up appointment       Return in about 4 weeks (around 06/24/2022) for annual physical .   Bonnita Hollow, MD

## 2022-05-27 NOTE — Assessment & Plan Note (Signed)
Seen on previous BMP few years ago, was symptomatic at the time Reports no ongoing symptoms status post DC of hydrochlorothiazide Repeat labs in 1 month

## 2022-05-27 NOTE — Assessment & Plan Note (Signed)
Recommend cessation, patient has been trying to reduce

## 2022-05-27 NOTE — Patient Instructions (Signed)
It was very nice to see you today!    Take care, Francis Soto  PLEASE NOTE:  If you had any lab tests please let us know if you have not heard back within a few days. You may see your results on mychart before we have a chance to review them but we will give you a call once they are reviewed by [place]. If we ordered any referrals today, please let us know if you have not heard from their office within the next week.   Please try these tips to maintain a healthy lifestyle:  Eat at least 3 REAL meals and 1-2 snacks per day.  Aim for no more than 5 hours between eating.  If you eat breakfast, please do so within one hour of getting up.   Each meal should contain half fruits/vegetables, one quarter protein, and one quarter carbs (no bigger than a computer mouse)  Cut down on sweet beverages. This includes juice, soda, and sweet tea.   Drink at least 1 glass of water with each meal and aim for at least 8 glasses per day  Exercise at least 150 minutes every week.

## 2022-05-27 NOTE — Assessment & Plan Note (Signed)
Stable Easily reducible Discussed return precautions for abdominal pain  Consider further treatment such as referral to to surgery at follow-up appointment

## 2022-06-21 ENCOUNTER — Telehealth: Payer: Self-pay | Admitting: Family Medicine

## 2022-06-21 DIAGNOSIS — I10 Essential (primary) hypertension: Secondary | ICD-10-CM

## 2022-06-21 MED ORDER — AMLODIPINE BESYLATE 10 MG PO TABS: 10.0000 mg | ORAL_TABLET | Freq: Every day | ORAL | 0 refills | Status: DC

## 2022-06-21 MED ORDER — LISINOPRIL 20 MG PO TABS: 20.0000 mg | ORAL_TABLET | Freq: Every day | ORAL | 0 refills | Status: DC

## 2022-06-21 NOTE — Telephone Encounter (Signed)
Pt is going out of town for 3 weeks. Has appt on 8/3. He is going to need refills on his Amlodipine and Lisinopril before that appt. Can you please refill for him  He uses [facility].

## 2022-06-21 NOTE — Telephone Encounter (Signed)
Chart supports rx. Last OV: [id number] Next OV: [id number]  Left patient a detailed voice message advising him that refills have been sent into pharmacy

## 2022-06-24 ENCOUNTER — Encounter: Payer: Commercial Managed Care - HMO | Admitting: Family Medicine

## 2022-07-20 ENCOUNTER — Telehealth: Payer: Self-pay | Admitting: Family Medicine

## 2022-07-20 DIAGNOSIS — I10 Essential (primary) hypertension: Secondary | ICD-10-CM

## 2022-07-20 MED ORDER — LISINOPRIL 20 MG PO TABS: 20.0000 mg | ORAL_TABLET | Freq: Every day | ORAL | 0 refills | Status: DC

## 2022-07-20 MED ORDER — AMLODIPINE BESYLATE 10 MG PO TABS: 10.0000 mg | ORAL_TABLET | Freq: Every day | ORAL | 0 refills | Status: DC

## 2022-07-20 NOTE — Telephone Encounter (Signed)
Caller Name: pt Call back phone #: [phone]  Reason for Call: Pt had to reschedule his appt this month due to work keeping him out of town. He needs a refill on his Amlodipine and lisinopril Before his next appt.

## 2022-07-20 NOTE — Telephone Encounter (Signed)
Chart supports rx. Last OV: 05/27/2022 Next OV: 08/26/2022  Spoke with patient and he verbalized understanding.

## 2022-07-20 NOTE — Telephone Encounter (Signed)
Please advise, see below.  Ok for 30 day with 0 refills? Patient rescheduled for 08/26/22.

## 2022-07-22 ENCOUNTER — Encounter: Payer: Commercial Managed Care - HMO | Admitting: Family Medicine

## 2022-08-24 ENCOUNTER — Telehealth: Payer: Self-pay | Admitting: Family Medicine

## 2022-08-24 DIAGNOSIS — I10 Essential (primary) hypertension: Secondary | ICD-10-CM

## 2022-08-24 MED ORDER — AMLODIPINE BESYLATE 10 MG PO TABS: 10.0000 mg | ORAL_TABLET | Freq: Every day | ORAL | 0 refills | Status: DC

## 2022-08-24 MED ORDER — LISINOPRIL 20 MG PO TABS: 20.0000 mg | ORAL_TABLET | Freq: Every day | ORAL | 0 refills | Status: DC

## 2022-08-24 NOTE — Telephone Encounter (Signed)
Caller Name: Kiante Ciavarella Call back phone #: [phone]   MEDICATION(S):  Amlodipine '10mg'$ , Lisinopril '20mg'$   Days of Med Remaining: 4 days  Has the patient contacted their pharmacy (YES/NO)? yes What did pharmacy advise? Pt had to resched cpe, can he get a refill to last him until appt set for 10/05  Preferred Pharmacy:  [place], [place] - [address] Phone:  [phone]  Fax:  [phone]   ~~~Please advise patient/caregiver to allow 2-3 business days to process RX refills.

## 2022-08-24 NOTE — Telephone Encounter (Signed)
Chart supports rx. Last OV: 05/27/2022 Next OV: 09/23/2022   Left patient a detailed voice message for patient advising him that refill has been sent in for lisinopril 20 mg and amlodipine 10 mg 30 days 0 refill.

## 2022-08-26 ENCOUNTER — Encounter: Payer: Commercial Managed Care - HMO | Admitting: Family Medicine

## 2022-09-21 ENCOUNTER — Telehealth: Payer: Self-pay | Admitting: Family Medicine

## 2022-09-21 DIAGNOSIS — I10 Essential (primary) hypertension: Secondary | ICD-10-CM

## 2022-09-21 MED ORDER — LISINOPRIL 20 MG PO TABS: 20.0000 mg | ORAL_TABLET | Freq: Every day | ORAL | 0 refills | Status: DC

## 2022-09-21 MED ORDER — AMLODIPINE BESYLATE 10 MG PO TABS: 10.0000 mg | ORAL_TABLET | Freq: Every day | ORAL | 0 refills | Status: DC

## 2022-09-21 NOTE — Telephone Encounter (Signed)
Chart supports rx. Last OV: 05/27/2022 Next OV: 10/29/2022

## 2022-09-21 NOTE — Telephone Encounter (Signed)
Caller Name: Francis Soto Call back phone #: [phone]  Reason for Call: Pt had to push cpe until November due to work. He will be out of bp medications by the end of this week. Please send refills to pharmacy

## 2022-09-22 ENCOUNTER — Other Ambulatory Visit: Payer: Self-pay | Admitting: Family Medicine

## 2022-09-22 DIAGNOSIS — I10 Essential (primary) hypertension: Secondary | ICD-10-CM

## 2022-09-23 ENCOUNTER — Encounter: Payer: Commercial Managed Care - HMO | Admitting: Family Medicine

## 2022-10-25 ENCOUNTER — Other Ambulatory Visit: Payer: Self-pay | Admitting: Family Medicine

## 2022-10-25 ENCOUNTER — Telehealth: Payer: Self-pay | Admitting: Family Medicine

## 2022-10-25 DIAGNOSIS — I10 Essential (primary) hypertension: Secondary | ICD-10-CM

## 2022-10-25 MED ORDER — LISINOPRIL 20 MG PO TABS: 20.0000 mg | ORAL_TABLET | Freq: Every day | ORAL | 2 refills | Status: DC

## 2022-10-25 MED ORDER — AMLODIPINE BESYLATE 10 MG PO TABS: 10.0000 mg | ORAL_TABLET | Freq: Every day | ORAL | 2 refills | Status: DC

## 2022-10-25 NOTE — Telephone Encounter (Signed)
Left patient a detailed voice message to advise patient of annotation below.

## 2022-10-25 NOTE — Telephone Encounter (Signed)
Caller Name: Bolden Hagerman Call back phone #: [phone]  Reason for Call: Pt had to push his cpe until the beginning of January due to changes in his insurance. His last visit was 6/08 and he was given meds for his bp. Can he get a refill to last him until scheduled appt.

## 2022-10-29 ENCOUNTER — Encounter: Payer: Commercial Managed Care - HMO | Admitting: Family Medicine

## 2022-12-23 ENCOUNTER — Encounter: Payer: Commercial Managed Care - HMO | Admitting: Family Medicine

## 2023-01-11 ENCOUNTER — Other Ambulatory Visit: Payer: Self-pay | Admitting: Family Medicine

## 2023-01-11 DIAGNOSIS — I10 Essential (primary) hypertension: Secondary | ICD-10-CM

## 2023-01-11 NOTE — Telephone Encounter (Signed)
Chart supports rx. Last OV: 05/27/2022 Next OV: 01/18/2022

## 2023-01-18 ENCOUNTER — Encounter: Payer: Self-pay | Admitting: Family Medicine

## 2023-01-27 ENCOUNTER — Telehealth: Payer: Self-pay | Admitting: Family Medicine

## 2023-01-27 NOTE — Telephone Encounter (Signed)
error 

## 2023-01-31 ENCOUNTER — Encounter: Payer: Self-pay | Admitting: Family Medicine

## 2023-01-31 DIAGNOSIS — Z0000 Encounter for general adult medical examination without abnormal findings: Secondary | ICD-10-CM

## 2023-01-31 DIAGNOSIS — I10 Essential (primary) hypertension: Secondary | ICD-10-CM

## 2023-02-08 ENCOUNTER — Encounter: Payer: 59 | Admitting: Family Medicine

## 2023-02-28 ENCOUNTER — Telehealth: Payer: Self-pay | Admitting: Family Medicine

## 2023-02-28 DIAGNOSIS — I10 Essential (primary) hypertension: Secondary | ICD-10-CM

## 2023-02-28 MED ORDER — AMLODIPINE BESYLATE 10 MG PO TABS: 10.0000 mg | ORAL_TABLET | Freq: Every day | ORAL | 3 refills | Status: DC

## 2023-02-28 MED ORDER — LISINOPRIL 20 MG PO TABS: 20.0000 mg | ORAL_TABLET | Freq: Every day | ORAL | 3 refills | Status: DC

## 2023-02-28 NOTE — Addendum Note (Signed)
Addended by: Josephine Igo B on: 02/28/2023 04:52 PM   Modules accepted: Orders

## 2023-02-28 NOTE — Telephone Encounter (Signed)
Prescription Request  02/28/2023  LOV: 05/27/2022  What is the name of the medication or equipment? amLODipine (NORVASC) 10 MG tablet [id number] and lisinopril (ZESTRIL) 20 MG tablet [id number]   Have you contacted your pharmacy to request a refill? Yes, he does have an upcoming cpe on 04/05/26. He had to cancel his cpe for tomorrow 03/01/23 because of work, he was called out of town.   Which pharmacy would you like this sent to?  [facility] - [place], [place] - [address] [address] Phone: [phone] Fax: [phone]    Patient notified that their request is being sent to the clinical staff for review and that they should receive a response within 2 business days.   Please advise at Mobile [phone] (mobile)

## 2023-03-01 ENCOUNTER — Encounter: Payer: 59 | Admitting: Family Medicine

## 2023-04-06 ENCOUNTER — Encounter: Payer: 59 | Admitting: Family Medicine

## 2023-09-30 ENCOUNTER — Encounter: Payer: Self-pay | Admitting: Family Medicine

## 2023-09-30 ENCOUNTER — Ambulatory Visit: Payer: Medicare HMO | Admitting: Family Medicine

## 2023-09-30 VITALS — BP 128/84 | HR 90 | Temp 97.9°F | Wt 212.0 lb

## 2023-09-30 DIAGNOSIS — H6993 Unspecified Eustachian tube disorder, bilateral: Secondary | ICD-10-CM | POA: Diagnosis not present

## 2023-09-30 DIAGNOSIS — J310 Chronic rhinitis: Secondary | ICD-10-CM

## 2023-09-30 DIAGNOSIS — R0982 Postnasal drip: Secondary | ICD-10-CM

## 2023-09-30 DIAGNOSIS — H66005 Acute suppurative otitis media without spontaneous rupture of ear drum, recurrent, left ear: Secondary | ICD-10-CM | POA: Insufficient documentation

## 2023-09-30 DIAGNOSIS — F17209 Nicotine dependence, unspecified, with unspecified nicotine-induced disorders: Secondary | ICD-10-CM | POA: Diagnosis not present

## 2023-09-30 MED ORDER — AMOXICILLIN-POT CLAVULANATE 875-125 MG PO TABS: 1.0000 | ORAL_TABLET | Freq: Two times a day (BID) | ORAL | 0 refills | Status: DC

## 2023-09-30 MED ORDER — AMOXICILLIN-POT CLAVULANATE 875-125 MG PO TABS: 1.0000 | ORAL_TABLET | Freq: Two times a day (BID) | ORAL | 0 refills | Status: AC

## 2023-09-30 MED ORDER — PREDNISONE 50 MG PO TABS: ORAL_TABLET | ORAL | 0 refills | Status: DC

## 2023-09-30 MED ORDER — METHYLPREDNISOLONE SODIUM SUCC 40 MG IJ SOLR
40.0000 mg | Freq: Once | INTRAMUSCULAR | Status: AC
  Administered 2023-09-30: 40 mg via INTRAMUSCULAR

## 2023-09-30 MED ORDER — PSEUDOEPHEDRINE HCL ER 240 MG PO TB24: 1.0000 | ORAL_TABLET | Freq: Every day | ORAL | 0 refills | Status: AC

## 2023-09-30 NOTE — Assessment & Plan Note (Addendum)
Likely worsened by smoking.  Plan Augmentin 875/125 mg tablet twice daily 10 days Prednisone 50 mg daily for 5 days Intramuscular methylprednisolone 40 mg x 1 given in the office Continue Flonase OTC pseudoephedrine 240 mg daily as needed congestion Follow-up as needed

## 2023-09-30 NOTE — Progress Notes (Addendum)
Assessment/Plan:   Problem List Items Addressed This Visit       Respiratory   Chronic rhinitis   Relevant Medications   predniSONE (DELTASONE) 50 MG tablet   Pseudoephedrine HCl 240 MG TB24   amoxicillin-clavulanate (AUGMENTIN) 875-125 MG tablet     Nervous and Auditory   ETD (Eustachian tube dysfunction), bilateral - Primary   Relevant Medications   predniSONE (DELTASONE) 50 MG tablet   Pseudoephedrine HCl 240 MG TB24   amoxicillin-clavulanate (AUGMENTIN) 875-125 MG tablet   Recurrent acute suppurative otitis media without spontaneous rupture of left tympanic membrane    Likely worsened by smoking.  Plan Augmentin 875/125 mg tablet twice daily 10 days Prednisone 50 mg daily for 5 days Intramuscular methylprednisolone 40 mg x 1 given in the office Continue Flonase OTC pseudoephedrine 240 mg daily as needed congestion Follow-up as needed      Relevant Medications   amoxicillin-clavulanate (AUGMENTIN) 875-125 MG tablet     Other   Tobacco use disorder, continuous   PND (post-nasal drip)   Relevant Medications   predniSONE (DELTASONE) 50 MG tablet   Pseudoephedrine HCl 240 MG TB24   amoxicillin-clavulanate (AUGMENTIN) 875-125 MG tablet    Medications Discontinued During This Encounter  Medication Reason   amoxicillin-clavulanate (AUGMENTIN) 875-125 MG tablet     Return in about 4 weeks (around 10/28/2023) for physical (fasting labs).    Subjective:   Encounter date: 09/30/2023  Francis Soto is a 67 y.o. male who has Hyponatremia; Leucocytosis; Insomnia; Chronic rhinitis; Class 2 obesity without serious comorbidity with body mass index (BMI) of 35.0 to 35.9 in adult; Claudication, intermittent (HCC); Essential hypertension; Loss of libido; Mild intermittent asthma without complication; Multiple atypical nevi; Tobacco use disorder, continuous; Umbilical hernia without obstruction and without gangrene; ETD (Eustachian tube dysfunction), bilateral; Recurrent acute  suppurative otitis media without spontaneous rupture of left tympanic membrane; and PND (post-nasal drip) on their problem list..   He  has a past medical history of Abnormal EKG (03/17/2017), Acute metabolic encephalopathy (02/20/2019), Altered mental status, Hypertension, Hypokalemia (02/20/2019), Hyponatremia, and Metabolic acidosis, normal anion gap (NAG) (02/20/2019).Marland Kitchen   He presents with chief complaint of Nasal Congestion (Nasal congestion and clogged ears x 1 month. Tx with flonase. ) .   HPI:   Presents with nasal congestion and clogged ears. 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 He describes a history of similar symptoms occurring every other year. This episode has been ongoing for the past 5-8 weeks. He denies any pain, fever, chest pain, shortness of breath.   Patient reports:  Left ear: Feels about 50% clogged. Right ear: Feels about 15-20% clogged. Occasional ringing in the left ear. Drainage felt down the back of the throat, but no visible drainage out of the ears. The patient has been using Flonase regularly and mentions getting treated with a Z-Pak and a steroid shot in the past for similar symptoms.    Review of Systems  HENT:  Positive for congestion, ear pain, sinus pain (some discomfort), sore throat and tinnitus. Negative for ear discharge and hearing loss.   Respiratory:  Negative for cough, shortness of breath and wheezing.   Cardiovascular:  Negative for chest pain.    No past surgical history on file.  Outpatient Medications Prior to Visit  Medication Sig Dispense Refill   amLODipine (NORVASC) 10 MG tablet Take 1 tablet (10 mg total) by mouth daily. 90 tablet 3   aspirin 325 MG tablet Take 1 tablet (  325 mg total) by mouth daily. (Patient taking differently: Take 325 mg by mouth daily. Takes 2 in morning , 2 afternoon and 2 at night.)     fluticasone (FLONASE) 50 MCG/ACT nasal spray Place 1 spray into both nostrils daily.      lisinopril  (ZESTRIL) 20 MG tablet Take 1 tablet (20 mg total) by mouth daily. 90 tablet 3   nicotine (NICODERM CQ - DOSED IN MG/24 HOURS) 14 mg/24hr patch Place 1 patch (14 mg total) onto the skin daily. (Patient not taking: Reported on 05/27/2022) 30 patch 0   No facility-administered medications prior to visit.    No family history on file.  Social History   Socioeconomic History   Marital status: Single    Spouse name: Not on file   Number of children: Not on file   Years of education: Not on file   Highest education level: Not on file  Occupational History   Not on file  Tobacco Use   Smoking status: Every Day    Current packs/day: 0.50    Average packs/day: 0.5 packs/day for 25.0 years (12.5 ttl pk-yrs)    Types: Cigarettes   Smokeless tobacco: Never  Vaping Use   Vaping status: Never Used  Substance and Sexual Activity   Alcohol use: Yes    Comment: occ   Drug use: Never   Sexual activity: Not Currently  Other Topics Concern   Not on file  Social History Narrative   Not on file   Social Determinants of Health   Financial Resource Strain: Not on file  Food Insecurity: Not on file  Transportation Needs: Not on file  Physical Activity: Not on file  Stress: Not on file  Social Connections: Not on file  Intimate Partner Violence: Not on file                                                                                                  Objective:  Physical Exam: BP 128/84 (BP Location: Right Arm, Patient Position: Sitting, Cuff Size: Large)   Pulse 90   Temp 97.9 F (36.6 C) (Temporal)   Wt 212 lb (96.2 kg)   SpO2 99%   BMI 34.74 kg/m     Physical Exam Constitutional:      Appearance: Normal appearance.  HENT:     Head: Normocephalic and atraumatic.     Right Ear: Hearing normal. There is impacted cerumen.     Left Ear: Hearing normal. No decreased hearing noted. No drainage. A middle ear effusion is present. Tympanic membrane is injected, erythematous and  bulging.     Nose: Nose normal.     Mouth/Throat:     Mouth: Mucous membranes are moist.     Pharynx: Oropharynx is clear.     Tonsils: No tonsillar exudate.  Eyes:     General: No scleral icterus.       Right eye: No discharge.        Left eye: No discharge.     Extraocular Movements: Extraocular movements intact.  Cardiovascular:     Rate and Rhythm: Normal  rate and regular rhythm.     Heart sounds: Normal heart sounds.  Pulmonary:     Effort: Pulmonary effort is normal.     Breath sounds: Normal breath sounds.  Abdominal:     Palpations: Abdomen is soft.     Tenderness: There is no abdominal tenderness.  Skin:    General: Skin is warm.     Findings: No rash.  Neurological:     General: No focal deficit present.     Mental Status: He is alert.     Cranial Nerves: No cranial nerve deficit.  Psychiatric:        Mood and Affect: Mood normal.        Behavior: Behavior normal.        Thought Content: Thought content normal.        Judgment: Judgment normal.     No results found.  No results found for this or any previous visit (from the past 2160 hour(s)).      Garner Nash, MD, MS

## 2023-09-30 NOTE — Patient Instructions (Signed)
Take Augmentin as prescribed: one pill twice a day for 10 days. Take prednisone as prescribed: 50 milligrams for 5 days. Use Flonase as you have been doing daily. You can use over-the-counter Sudafed (240 mg, 24-hour) short-term if needed for additional relief. Schedule a follow-up appointment in one month for a physical examination and fasting blood work.

## 2023-10-28 ENCOUNTER — Encounter: Payer: Self-pay | Admitting: Family Medicine

## 2023-11-01 ENCOUNTER — Other Ambulatory Visit: Payer: Self-pay

## 2023-11-01 DIAGNOSIS — I10 Essential (primary) hypertension: Secondary | ICD-10-CM

## 2023-11-01 MED ORDER — AMLODIPINE BESYLATE 10 MG PO TABS: 10.0000 mg | ORAL_TABLET | Freq: Every day | ORAL | 3 refills | Status: DC

## 2023-11-01 MED ORDER — LISINOPRIL 20 MG PO TABS: 20.0000 mg | ORAL_TABLET | Freq: Every day | ORAL | 3 refills | Status: DC

## 2023-11-29 ENCOUNTER — Encounter: Payer: Medicare HMO | Admitting: Family Medicine

## 2023-12-23 ENCOUNTER — Encounter: Payer: Self-pay | Admitting: Family Medicine

## 2024-04-13 ENCOUNTER — Ambulatory Visit (INDEPENDENT_AMBULATORY_CARE_PROVIDER_SITE_OTHER): Admitting: Family Medicine

## 2024-04-13 VITALS — BP 124/80 | HR 70 | Temp 97.5°F | Wt 211.4 lb

## 2024-04-13 DIAGNOSIS — F17209 Nicotine dependence, unspecified, with unspecified nicotine-induced disorders: Secondary | ICD-10-CM

## 2024-04-13 DIAGNOSIS — H66005 Acute suppurative otitis media without spontaneous rupture of ear drum, recurrent, left ear: Secondary | ICD-10-CM | POA: Diagnosis not present

## 2024-04-13 DIAGNOSIS — I10 Essential (primary) hypertension: Secondary | ICD-10-CM | POA: Diagnosis not present

## 2024-04-13 DIAGNOSIS — J310 Chronic rhinitis: Secondary | ICD-10-CM

## 2024-04-13 DIAGNOSIS — H6993 Unspecified Eustachian tube disorder, bilateral: Secondary | ICD-10-CM | POA: Diagnosis not present

## 2024-04-13 MED ORDER — AZELASTINE HCL 0.1 % NA SOLN: 2.0000 | Freq: Two times a day (BID) | NASAL | 12 refills | Status: AC

## 2024-04-13 MED ORDER — PREDNISONE 50 MG PO TABS: ORAL_TABLET | ORAL | 0 refills | Status: AC

## 2024-04-13 MED ORDER — CLINDAMYCIN HCL 300 MG PO CAPS: 300.0000 mg | ORAL_CAPSULE | Freq: Three times a day (TID) | ORAL | 0 refills | Status: AC

## 2024-04-13 MED ORDER — CEFPODOXIME PROXETIL 200 MG PO TABS: 200.0000 mg | ORAL_TABLET | Freq: Two times a day (BID) | ORAL | 0 refills | Status: AC

## 2024-04-13 NOTE — Progress Notes (Signed)
 Assessment/Plan:   Assessment & Plan Eustachian tube dysfunction with fluid accumulation Chronic eustachian tube dysfunction with fluid accumulation in the left ear, causing intermittent hearing loss and a sensation of fullness for 9-10 months. Previous improvement on steroids and antibiotics. Examination shows redness on the eardrum, suggesting possible inflammation or infection. Differential includes viral etiology, but bacterial infection cannot be ruled out. Reports improvement with ear manipulation, indicating possible mechanical obstruction. - Prescribe prednisone  50 mg daily for 5 days. - Prescribe cefotaxime 200 mg twice daily for 10 days. - Prescribe clindamycin 300 mg twice daily for 10 days. - Follow up to otolaryngology for further evaluation and management. - Encourage smoking cessation.  Allergic rhinitis Possible allergic rhinitis contributing to eustachian tube dysfunction. Symptoms include nasal congestion and long-term Flonase  use. Reports a musty smell in the apartment, raising concern for mold exposure, but no respiratory symptoms are present. Azelastine nasal spray is recommended as an alternative to Flonase . - Prescribe azelastine nasal spray, one spray in each nostril twice daily as needed.  Hypertension Hypertension is well-controlled with current medication regimen. Blood pressure today is 124/80 mmHg. - Continue amlodipine  10 mg daily. - Continue lisinopril  20 mg daily.      Medications Discontinued During This Encounter  Medication Reason   aspirin  325 MG tablet    nicotine  (NICODERM CQ  - DOSED IN MG/24 HOURS) 14 mg/24hr patch    predniSONE  (DELTASONE ) 50 MG tablet Reorder    Return in about 1 month (around 05/13/2024) for physical (fasting labs).    Subjective:   Encounter date: 04/13/2024  Francis Soto is a 68 y.o. male who has Hyponatremia; Leucocytosis; Insomnia; Chronic rhinitis; Class 2 obesity without serious comorbidity with body mass index  (BMI) of 35.0 to 35.9 in adult; Claudication, intermittent (HCC); Essential hypertension; Loss of libido; Mild intermittent asthma without complication; Multiple atypical nevi; Tobacco use disorder, continuous; Umbilical hernia without obstruction and without gangrene; ETD (Eustachian tube dysfunction), bilateral; Recurrent acute suppurative otitis media without spontaneous rupture of left tympanic membrane; and PND (post-nasal drip) on their problem list..   He  has a past medical history of Abnormal EKG (03/17/2017), Acute metabolic encephalopathy (02/20/2019), Altered mental status, Hypertension, Hypokalemia (02/20/2019), Hyponatremia, and Metabolic acidosis, normal anion gap (NAG) (02/20/2019).Francis Soto   He presents with chief complaint of Ear Pain (Ear fullness in right and left ear, sinus congestion since October's visit. ) .   Discussed the use of AI scribe software for clinical note transcription with the patient, who gave verbal consent to proceed.  History of Present Illness Francis Soto "Francis Soto" is a 68 year old male with hypertension who presents with ear congestion and hearing issues.  He experiences ear congestion, primarily in the left ear, which feels about 60-70% closed, while the right ear feels 15-20% closed. The sensation is described as if there is a balloon or fluid in the ear, and the congestion comes and goes. Pulling his ears back temporarily improves his hearing, particularly in the left ear.  He has a history of similar symptoms occurring in October, which were previously treated with steroids and antibiotics, resulting in significant improvement. However, he did not use Sudafed due to concerns about its effect on his blood pressure.  He is concerned about potential mold exposure in his apartment due to previous water leaks from the unit above, which he suspects might be contributing to his symptoms. He reports a musty smell in the vents but has no respiratory symptoms such as cough or  shortness of  breath.  He has a history of psoriasis-like symptoms on the back of his head, which have since moved to his ears, causing itching and the presence of white flaky material resembling dandruff. He wonders if this could be related to his ear issues.  He is currently taking amlodipine  10 mg daily and lisinopril  20 mg daily for hypertension, which is well-controlled. He previously used Flonase  daily for four to five years but has since stopped. He is not currently using any nasal sprays.       No past surgical history on file.  Outpatient Medications Prior to Visit  Medication Sig Dispense Refill   amLODipine  (NORVASC ) 10 MG tablet Take 1 tablet (10 mg total) by mouth daily. 90 tablet 3   fluticasone  (FLONASE ) 50 MCG/ACT nasal spray Place 1 spray into both nostrils daily.      lisinopril  (ZESTRIL ) 20 MG tablet Take 1 tablet (20 mg total) by mouth daily. 90 tablet 3   Pseudoephedrine  HCl 240 MG TB24 Take 1 tablet (240 mg total) by mouth daily. 14 tablet 0   aspirin  325 MG tablet Take 1 tablet (325 mg total) by mouth daily. (Patient taking differently: Take 325 mg by mouth daily. Takes 2 in morning , 2 afternoon and 2 at night.)     predniSONE  (DELTASONE ) 50 MG tablet Take 1 tablet daily for 5 days. 5 tablet 0   nicotine  (NICODERM CQ  - DOSED IN MG/24 HOURS) 14 mg/24hr patch Place 1 patch (14 mg total) onto the skin daily. (Patient not taking: Reported on 04/13/2024) 30 patch 0   No facility-administered medications prior to visit.    No family history on file.  Social History   Socioeconomic History   Marital status: Single    Spouse name: Not on file   Number of children: Not on file   Years of education: Not on file   Highest education level: Associate degree: academic program  Occupational History   Not on file  Tobacco Use   Smoking status: Every Day    Current packs/day: 0.50    Average packs/day: 0.5 packs/day for 25.0 years (12.5 ttl pk-yrs)    Types: Cigarettes    Smokeless tobacco: Never  Vaping Use   Vaping status: Never Used  Substance and Sexual Activity   Alcohol use: Yes    Comment: occ   Drug use: Never   Sexual activity: Not Currently  Other Topics Concern   Not on file  Social History Narrative   Not on file   Social Drivers of Health   Financial Resource Strain: Low Risk  (04/13/2024)   Overall Financial Resource Strain (CARDIA)    Difficulty of Paying Living Expenses: Not very hard  Food Insecurity: No Food Insecurity (04/13/2024)   Hunger Vital Sign    Worried About Running Out of Food in the Last Year: Never true    Ran Out of Food in the Last Year: Never true  Transportation Needs: No Transportation Needs (04/13/2024)   PRAPARE - Administrator, Civil Service (Medical): No    Lack of Transportation (Non-Medical): No  Physical Activity: Insufficiently Active (04/13/2024)   Exercise Vital Sign    Days of Exercise per Week: 3 days    Minutes of Exercise per Session: 20 min  Stress: No Stress Concern Present (04/13/2024)   [employer] of Occupational Health - Occupational Stress Questionnaire    Feeling of Stress : Not at all  Social Connections: Moderately Isolated (04/13/2024)   Social Connection  and Isolation Panel [NHANES]    Frequency of Communication with Friends and Family: More than three times a week    Frequency of Social Gatherings with Friends and Family: Three times a week    Attends Religious Services: More than 4 times per year    Active Member of Clubs or Organizations: No    Attends Engineer, structural: Not on file    Marital Status: Never married  Intimate Partner Violence: Not on file                                                                                                  Objective:  Physical Exam: BP 124/80   Pulse 70   Temp (!) 97.5 F (36.4 C) (Temporal)   Wt 211 lb 6.4 oz (95.9 kg)   SpO2 98%   BMI 34.64 kg/m    Physical Exam  GENERAL: Alert,  cooperative, well developed, no acute distress. HEENT: Normocephalic, normal oropharynx, moist mucous membranes. Redness in ear canals and on tympanic membranes bilaterally, effusion on left side. CHEST: Clear to auscultation bilaterally, no wheezes, rhonchi, or crackles. CARDIOVASCULAR: Normal heart rate and rhythm, S1 and S2 normal without murmurs. ABDOMEN: Soft, non-tender, non-distended, without organomegaly, normal bowel sounds. EXTREMITIES: No cyanosis or edema. NEUROLOGICAL: Cranial nerves grossly intact, moves all extremities without gross motor or sensory deficit.     No results found.  No results found for this or any previous visit (from the past 2160 hours).      Carnell Christian, MD, MS

## 2024-04-13 NOTE — Patient Instructions (Signed)
  VISIT SUMMARY: During today's visit, we discussed your ear congestion and hearing issues, which have been ongoing for several months. We also reviewed your history of hypertension and possible allergic rhinitis. You expressed concerns about potential mold exposure in your apartment and its impact on your symptoms.  YOUR PLAN: -EUSTACHIAN TUBE DYSFUNCTION WITH FLUID ACCUMULATION: Eustachian tube dysfunction occurs when the tube connecting your middle ear to the back of your nose becomes blocked or does not open properly, leading to fluid buildup and a feeling of fullness in the ear. We will treat this with prednisone  50 mg daily for 5 days, cefotaxime 200 mg twice daily for 10 days, and clindamycin 300 mg twice daily for 10 days. You are also being referred to an ear, nose, and throat specialist for further evaluation. Please try to quit smoking as it can worsen your symptoms.  -POSSIBLE ALLERGIC RHINITIS: Allergic rhinitis is an allergic reaction that causes sneezing, congestion, and a runny nose. It may be contributing to your ear issues. We recommend using azelastine nasal spray, one spray in each nostril twice daily as needed, as an alternative to Flonase .  -HYPERTENSION: Hypertension, or high blood pressure, is well-controlled with your current medications. Continue taking amlodipine  10 mg daily and lisinopril  20 mg daily as prescribed.  INSTRUCTIONS: Please follow up with the otolaryngologist as soon as possible for further evaluation of your ear congestion and hearing issues. Continue taking your prescribed medications and using the azelastine nasal spray as needed. If you experience any new or worsening symptoms, contact our office immediately.

## 2024-04-23 ENCOUNTER — Telehealth: Payer: Self-pay

## 2024-04-23 NOTE — Telephone Encounter (Signed)
 Forwarding message below. LOV 04/13/2024

## 2024-04-23 NOTE — Telephone Encounter (Signed)
 Copied from CRM [id number]. Topic: Clinical - Medication Question >> Apr 20, 2024  9:16 AM Albertha Alosa wrote: Reason for CRM: Patient called in regarding predniSONE  (DELTASONE ) 50 MG tablet, stated it did work for him, was wondering if Dr.Thompson would be able to send it some more?

## 2024-04-24 ENCOUNTER — Other Ambulatory Visit: Payer: Self-pay | Admitting: Family Medicine

## 2024-04-24 DIAGNOSIS — J310 Chronic rhinitis: Secondary | ICD-10-CM

## 2024-04-24 DIAGNOSIS — H6993 Unspecified Eustachian tube disorder, bilateral: Secondary | ICD-10-CM

## 2024-04-24 DIAGNOSIS — H66005 Acute suppurative otitis media without spontaneous rupture of ear drum, recurrent, left ear: Secondary | ICD-10-CM

## 2024-04-24 NOTE — Telephone Encounter (Signed)
Pt checking on status of this message.  °

## 2024-04-25 NOTE — Telephone Encounter (Signed)
 Called Pt to schedule OV; left VM for call back.

## 2024-04-25 NOTE — Telephone Encounter (Signed)
 Called Pt to schedule OV, left VM for call back

## 2024-04-26 NOTE — Telephone Encounter (Signed)
 Noted.

## 2024-06-19 DIAGNOSIS — H903 Sensorineural hearing loss, bilateral: Secondary | ICD-10-CM | POA: Diagnosis not present

## 2024-10-29 DIAGNOSIS — H52223 Regular astigmatism, bilateral: Secondary | ICD-10-CM | POA: Diagnosis not present

## 2024-10-29 DIAGNOSIS — H5203 Hypermetropia, bilateral: Secondary | ICD-10-CM | POA: Diagnosis not present

## 2024-11-07 ENCOUNTER — Other Ambulatory Visit: Payer: Self-pay | Admitting: Family Medicine

## 2024-11-07 DIAGNOSIS — I10 Essential (primary) hypertension: Secondary | ICD-10-CM

## 2024-12-21 ENCOUNTER — Encounter: Admitting: Family Medicine

## 2024-12-30 ENCOUNTER — Other Ambulatory Visit: Payer: Self-pay

## 2024-12-30 ENCOUNTER — Emergency Department (HOSPITAL_BASED_OUTPATIENT_CLINIC_OR_DEPARTMENT_OTHER): Payer: Medicare (Managed Care)

## 2024-12-30 ENCOUNTER — Encounter (HOSPITAL_BASED_OUTPATIENT_CLINIC_OR_DEPARTMENT_OTHER): Payer: Self-pay

## 2024-12-30 ENCOUNTER — Emergency Department (HOSPITAL_BASED_OUTPATIENT_CLINIC_OR_DEPARTMENT_OTHER)
Admission: EM | Admit: 2024-12-30 | Discharge: 2024-12-30 | Disposition: A | Discharge status: Home / Self Care | Payer: Medicare (Managed Care)

## 2024-12-30 DIAGNOSIS — K436 Other and unspecified ventral hernia with obstruction, without gangrene: Secondary | ICD-10-CM | POA: Insufficient documentation

## 2024-12-30 DIAGNOSIS — Z79899 Other long term (current) drug therapy: Secondary | ICD-10-CM | POA: Insufficient documentation

## 2024-12-30 DIAGNOSIS — I10 Essential (primary) hypertension: Secondary | ICD-10-CM | POA: Insufficient documentation

## 2024-12-30 LAB — COMPREHENSIVE METABOLIC PANEL WITH GFR
ALT: 18 U/L (ref 0–44)
AST: 19 U/L (ref 15–41)
Albumin: 4.4 g/dL (ref 3.5–5.0)
Alkaline Phosphatase: 102 U/L (ref 38–126)
Anion gap: 15 (ref 5–15)
BUN: 13 mg/dL (ref 8–23)
CO2: 19 mmol/L — ABNORMAL LOW (ref 22–32)
Calcium: 9.1 mg/dL (ref 8.9–10.3)
Chloride: 104 mmol/L (ref 98–111)
Creatinine, Ser: 0.73 mg/dL (ref 0.61–1.24)
GFR, Estimated: >60 mL/min
Glucose, Bld: 174 mg/dL — ABNORMAL HIGH (ref 70–99)
Potassium: 4.1 mmol/L (ref 3.5–5.1)
Sodium: 137 mmol/L (ref 135–145)
Total Bilirubin: 0.4 mg/dL (ref 0.0–1.2)
Total Protein: 7.4 g/dL (ref 6.5–8.1)

## 2024-12-30 LAB — CBC
HCT: 51.7 % (ref 39.0–52.0)
Hemoglobin: 17.9 g/dL — ABNORMAL HIGH (ref 13.0–17.0)
MCH: 31.9 pg (ref 26.0–34.0)
MCHC: 34.6 g/dL (ref 30.0–36.0)
MCV: 92.2 fL (ref 80.0–100.0)
Platelets: 285 K/uL (ref 150–400)
RBC: 5.61 MIL/uL (ref 4.22–5.81)
RDW: 13 % (ref 11.5–15.5)
WBC: 18.7 K/uL — ABNORMAL HIGH (ref 4.0–10.5)
nRBC: 0 % (ref 0.0–0.2)

## 2024-12-30 LAB — LIPASE, BLOOD: Lipase: 25 U/L (ref 11–51)

## 2024-12-30 LAB — URINALYSIS, ROUTINE W REFLEX MICROSCOPIC
Bilirubin Urine: NEGATIVE
Glucose, UA: NEGATIVE mg/dL
Ketones, ur: 15 mg/dL — AB
Leukocytes,Ua: NEGATIVE
Nitrite: NEGATIVE
Protein, ur: NEGATIVE mg/dL
Specific Gravity, Urine: 1.025 (ref 1.005–1.030)
pH: 6.5 (ref 5.0–8.0)

## 2024-12-30 LAB — URINALYSIS, MICROSCOPIC (REFLEX)

## 2024-12-30 LAB — LACTIC ACID, PLASMA: Lactic Acid, Venous: 1.4 mmol/L (ref 0.5–1.9)

## 2024-12-30 MED ORDER — HYDROMORPHONE HCL 1 MG/ML IJ SOLN
0.5000 mg | Freq: Once | INTRAMUSCULAR | Status: AC
  Administered 2024-12-30: 0.5 mg via INTRAVENOUS
  Filled 2024-12-30: qty 1

## 2024-12-30 MED ORDER — IOHEXOL 300 MG/ML  SOLN
100.0000 mL | Freq: Once | INTRAMUSCULAR | Status: AC | PRN
  Administered 2024-12-30: 100 mL via INTRAVENOUS

## 2024-12-30 MED ORDER — ONDANSETRON HCL 4 MG/2ML IJ SOLN
4.0000 mg | Freq: Once | INTRAMUSCULAR | Status: AC
  Administered 2024-12-30: 4 mg via INTRAVENOUS
  Filled 2024-12-30: qty 2

## 2024-12-30 NOTE — ED Triage Notes (Signed)
 Reports abd hernia for 8 years, states was laying on abd last night, hernia popped back in then out, abd pain since.  N/V, sweating since this morning.

## 2024-12-30 NOTE — ED Notes (Signed)
 ED Provider at bedside.

## 2024-12-30 NOTE — Discharge Instructions (Signed)
 Please read and follow all provided instructions.  Your diagnoses today include:  1. Ventral hernia with bowel obstruction     Tests performed today include: Complete blood cell count: Elevated infection fighting/stress cells in the blood Complete metabolic panel: Normal liver function test Lipase (pancreas function test): Was normal Urinalysis (urine test): No sign of UTI CT scan shows swelling related to your hernia and low-grade small bowel obstruction.  Fortunately, we were able to reduce the hernia which should improve your symptoms. Vital signs. See below for your results today.   Medications prescribed:  None  Take any prescribed medications only as directed.  Home care instructions:  Follow any educational materials contained in this packet.  Follow-up instructions: You should follow-up with general surgery within the next 1 week.  This is for evaluation of your hernia.  Return instructions:  SEEK IMMEDIATE MEDICAL ATTENTION IF: The pain does not go away or becomes severe  Signs of a trapped hernia include worsening pain, vomiting, increasing abdominal swelling or bloating, not passing any gas or not having bowel movements A temperature above 101F develops  Repeated vomiting occurs (multiple episodes)  The pain becomes localized to portions of the abdomen. The right side could possibly be appendicitis. In an adult, the left lower portion of the abdomen could be colitis or diverticulitis.  Blood is being passed in stools or vomit (bright red or black tarry stools)  You develop chest pain, difficulty breathing, dizziness or fainting, or become confused, poorly responsive, or inconsolable (young children) If you have any other emergent concerns regarding your health  Additional Information: Abdominal (belly) pain can be caused by many things. Your caregiver performed an examination and possibly ordered blood/urine tests and imaging (CT scan, x-rays, ultrasound). Many cases  can be observed and treated at home after initial evaluation in the emergency department. Even though you are being discharged home, abdominal pain can be unpredictable. Therefore, you need a repeated exam if your pain does not resolve, returns, or worsens. Most patients with abdominal pain don't have to be admitted to the hospital or have surgery, but serious problems like appendicitis and gallbladder attacks can start out as nonspecific pain. Many abdominal conditions cannot be diagnosed in one visit, so follow-up evaluations are very important.  Your vital signs today were: BP (!) 163/109   Pulse 86   Temp 97.8 F (36.6 C) (Oral)   Resp 18   SpO2 96%  If your blood pressure (bp) was elevated above 135/85 this visit, please have this repeated by your doctor within one month. --------------

## 2024-12-30 NOTE — ED Provider Notes (Signed)
 " [place] [facility] AT [facility] Provider Note   CSN: [id number] Arrival date & time: 12/30/24  1132     Patient presents with: Abdominal Pain   Francis Soto is a 69 y.o. male.   Patient with known ventral hernia, chronic, history of hypertension --presents to the emergency department today for evaluation of abdominal pain and vomiting.  Patient states that he was lying down last night, including on his abdomen, watching a football game.  After laying on his abdomen for about 30 minutes he noted that the hernia had gone back inside.  This is not unusual as he states that it typically can go in and out.  Later in the evening the hernia came back out.  He did note some soreness that increased during the course of the night, especially when he got up to walk.  This morning he had several episodes of vomiting.  He reports having a bowel movement last night that appeared normal without blood.  He was on certain if he developed a stomach flu or was having difficulties from the hernia.  He denies fever or respiratory symptoms.       Prior to Admission medications  Medication Sig Start Date End Date Taking? Authorizing Provider  amLODipine  (NORVASC ) 10 MG tablet TAKE 1 TABLET BY MOUTH DAILY 11/08/24   Sebastian Beverley NOVAK, MD  azelastine  (ASTELIN ) 0.1 % nasal spray Place 2 sprays into both nostrils 2 (two) times daily. 04/13/24   Sebastian Beverley NOVAK, MD  fluticasone  (FLONASE ) 50 MCG/ACT nasal spray Place 1 spray into both nostrils daily.  02/14/19   [provider]  lisinopril  (ZESTRIL ) 20 MG tablet TAKE 1 TABLET BY MOUTH DAILY 11/08/24   Sebastian Beverley NOVAK, MD  predniSONE  (DELTASONE ) 50 MG tablet Take 1 tablet daily for 5 days. 04/13/24   Sebastian Beverley NOVAK, MD  Pseudoephedrine  HCl 240 MG TB24 Take 1 tablet (240 mg total) by mouth daily. 09/30/23   Sebastian Beverley NOVAK, MD    Allergies: Patient has no known allergies.    Review of Systems  Updated Vital Signs BP  (!) 144/87 (BP Location: Right Arm)   Pulse 90   Temp 97.8 F (36.6 C) (Oral)   Resp 18   SpO2 100%   Physical Exam Vitals and nursing note reviewed.  Constitutional:      Appearance: He is well-developed.  HENT:     Head: Normocephalic and atraumatic.  Eyes:     Conjunctiva/sclera: Conjunctivae normal.  Pulmonary:     Effort: No respiratory distress.  Abdominal:     Tenderness: There is no guarding or rebound. Negative signs include Murphy's sign and McBurney's sign.     Hernia: A hernia is present. Hernia is present in the ventral area.     Comments: Patient with ventral hernia noted about the size of a golf ball, firm, but not red or erythematous.  Not readily reproducible with pressure.  Musculoskeletal:     Cervical back: Normal range of motion and neck supple.  Skin:    General: Skin is warm and dry.  Neurological:     Mental Status: He is alert.     (all labs ordered are listed, but only abnormal results are displayed) Labs Reviewed  COMPREHENSIVE METABOLIC PANEL WITH GFR - Abnormal; Notable for the following components:      Result Value   CO2 19 (*)    Glucose, Bld 174 (*)    All other components within normal limits  CBC -  Abnormal; Notable for the following components:   WBC 18.7 (*)    Hemoglobin 17.9 (*)    All other components within normal limits  URINALYSIS, ROUTINE W REFLEX MICROSCOPIC - Abnormal; Notable for the following components:   Hgb urine dipstick TRACE (*)    Ketones, ur 15 (*)    All other components within normal limits  URINALYSIS, MICROSCOPIC (REFLEX) - Abnormal; Notable for the following components:   Bacteria, UA RARE (*)    All other components within normal limits  LIPASE, BLOOD  LACTIC ACID, PLASMA    EKG: None  Radiology: CT ABDOMEN PELVIS W CONTRAST Result Date: 12/30/2024 CLINICAL DATA:  Increased pain at hernia site. EXAM: CT ABDOMEN AND PELVIS WITH CONTRAST TECHNIQUE: Multidetector CT imaging of the abdomen and pelvis  was performed using the standard protocol following bolus administration of intravenous contrast. RADIATION DOSE REDUCTION: This exam was performed according to the departmental dose-optimization program which includes automated exposure control, adjustment of the mA and/or kV according to patient size and/or use of iterative reconstruction technique. CONTRAST:  OMNIPAQUE  IOHEXOL  300 MG/ML  SOLN COMPARISON:  None available FINDINGS: Lower chest: Left base scarring. Subtle centrilobular ground-glass nodularity likely is indicative of respiratory bronchiolitis in this current smoker. Normal heart size without pericardial or pleural effusion. Hepatobiliary: Normal liver. Normal gallbladder, without biliary ductal dilatation. Pancreas: Normal, without mass or ductal dilatation. Spleen: Normal in size, without focal abnormality. Adrenals/Urinary Tract: Normal right adrenal gland. Left adrenal well-circumscribed 3.4 x 3.4 cm lesion measures 52 HU on portal venous phase imaging and 31 HU on nephrographic delayed images. Left renal cysts of up to 4.0 cm do not warrant specific imaging follow-up. Normal urinary bladder. Stomach/Bowel: Normal stomach, without wall thickening. Extensive colonic diverticulosis. Wall thickening involving the sigmoid is most likely related to muscular hypertrophy on image 58/2. Normal terminal ileum and appendix. Ventral abdominal hernia contains small bowel including on image 56/2. There is edema and small volume fluid within the hernia sac including inferiorly on image 63/2. The upstream small bowel is borderline dilated at 2.7 cm on image 51/2. The downstream small bowel is minimally decompressed. No pneumatosis. Vascular/Lymphatic: Advanced aortic and branch vessel atherosclerosis. No abdominopelvic adenopathy. Reproductive: Moderate prostatomegaly. Other: No free intraperitoneal air. 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 IMPRESSION: Ventral  abdominal wall hernia contains small bowel. Given surrounding mild edema and fluid, cannot exclude strangulation. Mild upstream small bowel dilatation, consistent with low-grade obstruction. Left adrenal nodule which demonstrates washout characteristics consistent with an adenoma. No follow-up indicated. Aortic Atherosclerosis (ICD10-I70.0). Prostatomegaly. Electronically Signed   By: Rockey Kilts M.D.   On: 12/30/2024 14:33     Procedures   Medications Ordered in the ED - No data to display  ED Course  Patient seen and examined. History obtained directly from patient.  Sister is at bedside as well.  Labs/EKG: Independently reviewed and interpreted.  This included: CBC with elevated white blood cell count of 18.7, hemoglobin high at 17.9 otherwise unremarkable; CMP glucose 174; lipase normal; UA without signs of infection.  Imaging: Ordered CT abdomen pelvis to evaluate for signs of incarcerated hernia/small bowel obstruction.  Medications/Fluids: None ordered Most recent vital signs reviewed and are as follows: BP (!) 144/87 (BP Location: Right Arm)   Pulse 90   Temp 97.8 F (36.6 C) (Oral)   Resp 18   SpO2 100%   Initial impression: Hernia pain, vomiting.  //  Reviewed CT with Dr. Neysa. Awaiting CT read. Will  need attempt at reduction due to ? SBO.   3:16 PM Reassessment performed. Patient appears stable.  Hernia successfully reduced by Dr. Patt and myself after administration of Dilaudid , application of ice and patient placed in Trendelenburg.  Imaging personally visualized and interpreted including: CT abdomen and pelvis show inflamed ventral hernia with signs of mild/low-grade bowel obstruction.  Reviewed pertinent lab work and imaging with patient at bedside. Questions answered.   Most current vital signs reviewed and are as follows: BP (!) 161/92   Pulse 78   Temp 97.8 F (36.6 C) (Oral)   Resp 20   SpO2 95%   Plan: Will attempt to apply Ace wrap as binder.  Will  make sure that he is able to drink fluids and not vomit.  Strongly recommend outpatient follow-up with general surgery.  4:05 PM Reassessment performed. Patient appears stable.  He has had water to drink, no vomiting.  He looks well and feels ready to be discharged.  Most current vital signs reviewed and are as follows: BP (!) 163/109   Pulse 86   Temp 97.8 F (36.6 C) (Oral)   Resp 18   SpO2 96%   Plan: Discharge to home.   Prescriptions written for: None  Other home care instructions discussed: We discussed signs and symptoms of incarcerated hernia, recurrent obstruction.  Patient will monitor for these.  ED return instructions discussed: Return with worsening pain, swelling, vomiting.  Follow-up instructions discussed: Patient encouraged to follow-up with surgeon in 1 week.                                   Medical Decision Making Amount and/or Complexity of Data Reviewed Labs: ordered. Radiology: ordered.  Risk Prescription drug management.   For this patient's complaint of abdominal pain, the following conditions were considered on the differential diagnosis: gastritis/PUD, enteritis/duodenitis, appendicitis, cholelithiasis/cholecystitis, cholangitis, pancreatitis, ruptured viscus, colitis, diverticulitis, small/large bowel obstruction, proctitis, cystitis, pyelonephritis, ureteral colic, aortic dissection, aortic aneurysm. Atypical chest etiologies were also considered including ACS, PE, and pneumonia.  Patient has a ventral hernia, signs of low-grade bowel obstruction which is consistent with history and appearance.  Fortunately, hernia reduction performed.  Patient did well subsequently.  Binder was applied.  He is tolerating fluids.  Feels better, ready for discharge.  Encouraged outpatient follow-up and patient seems motivated to follow-up.  The patient's vital signs, pertinent lab work and imaging were reviewed and interpreted as discussed in the ED course.  Hospitalization was considered for further testing, treatments, or serial exams/observation. However as patient is well-appearing, has a stable exam, and reassuring studies today, I do not feel that they warrant admission at this time. This plan was discussed with the patient who verbalizes agreement and comfort with this plan and seems reliable and able to return to the Emergency Department with worsening or changing symptoms.        Final diagnoses:  Ventral hernia with bowel obstruction    ED Discharge Orders     None          Desiderio Chew, PA-C 12/30/24 1607    Patt Alm Macho, MD 12/30/24 2308  "

## 2025-01-01 ENCOUNTER — Other Ambulatory Visit: Payer: Self-pay

## 2025-01-01 ENCOUNTER — Emergency Department (HOSPITAL_BASED_OUTPATIENT_CLINIC_OR_DEPARTMENT_OTHER): Payer: Medicare (Managed Care)

## 2025-01-01 ENCOUNTER — Encounter (HOSPITAL_BASED_OUTPATIENT_CLINIC_OR_DEPARTMENT_OTHER): Payer: Self-pay | Admitting: Emergency Medicine

## 2025-01-01 ENCOUNTER — Inpatient Hospital Stay (HOSPITAL_BASED_OUTPATIENT_CLINIC_OR_DEPARTMENT_OTHER)
Admission: EM | Admit: 2025-01-01 | Discharge: 2025-01-04 | DRG: 389 | Disposition: A | Discharge status: Home / Self Care | Payer: Medicare (Managed Care) | Attending: Surgery | Admitting: Surgery

## 2025-01-01 ENCOUNTER — Inpatient Hospital Stay (HOSPITAL_COMMUNITY): Payer: Medicare (Managed Care)

## 2025-01-01 DIAGNOSIS — F17210 Nicotine dependence, cigarettes, uncomplicated: Secondary | ICD-10-CM | POA: Diagnosis present

## 2025-01-01 DIAGNOSIS — I10 Essential (primary) hypertension: Secondary | ICD-10-CM | POA: Diagnosis present

## 2025-01-01 DIAGNOSIS — R112 Nausea with vomiting, unspecified: Secondary | ICD-10-CM

## 2025-01-01 DIAGNOSIS — Z7982 Long term (current) use of aspirin: Secondary | ICD-10-CM

## 2025-01-01 DIAGNOSIS — Z79899 Other long term (current) drug therapy: Secondary | ICD-10-CM | POA: Diagnosis not present

## 2025-01-01 DIAGNOSIS — E860 Dehydration: Secondary | ICD-10-CM | POA: Diagnosis present

## 2025-01-01 DIAGNOSIS — D72829 Elevated white blood cell count, unspecified: Secondary | ICD-10-CM | POA: Diagnosis present

## 2025-01-01 DIAGNOSIS — Z6831 Body mass index (BMI) 31.0-31.9, adult: Secondary | ICD-10-CM | POA: Diagnosis not present

## 2025-01-01 DIAGNOSIS — E669 Obesity, unspecified: Secondary | ICD-10-CM | POA: Diagnosis present

## 2025-01-01 DIAGNOSIS — K439 Ventral hernia without obstruction or gangrene: Secondary | ICD-10-CM | POA: Diagnosis present

## 2025-01-01 DIAGNOSIS — K562 Volvulus: Secondary | ICD-10-CM | POA: Diagnosis present

## 2025-01-01 DIAGNOSIS — K56609 Unspecified intestinal obstruction, unspecified as to partial versus complete obstruction: Secondary | ICD-10-CM | POA: Insufficient documentation

## 2025-01-01 DIAGNOSIS — I700 Atherosclerosis of aorta: Secondary | ICD-10-CM

## 2025-01-01 DIAGNOSIS — K5790 Diverticulosis of intestine, part unspecified, without perforation or abscess without bleeding: Secondary | ICD-10-CM | POA: Diagnosis present

## 2025-01-01 DIAGNOSIS — K56600 Partial intestinal obstruction, unspecified as to cause: Principal | ICD-10-CM

## 2025-01-01 DIAGNOSIS — K430 Incisional hernia with obstruction, without gangrene: Secondary | ICD-10-CM | POA: Diagnosis present

## 2025-01-01 LAB — CBC WITH DIFFERENTIAL/PLATELET
Abs Immature Granulocytes: 0.09 K/uL — ABNORMAL HIGH (ref 0.00–0.07)
Basophils Absolute: 0.1 K/uL (ref 0.0–0.1)
Basophils Relative: 0 %
Eosinophils Absolute: 0 K/uL (ref 0.0–0.5)
Eosinophils Relative: 0 %
HCT: 50.4 % (ref 39.0–52.0)
Hemoglobin: 17.6 g/dL — ABNORMAL HIGH (ref 13.0–17.0)
Immature Granulocytes: 0 %
Lymphocytes Relative: 7 %
Lymphs Abs: 1.5 K/uL (ref 0.7–4.0)
MCH: 31.7 pg (ref 26.0–34.0)
MCHC: 34.9 g/dL (ref 30.0–36.0)
MCV: 90.8 fL (ref 80.0–100.0)
Monocytes Absolute: 1.9 K/uL — ABNORMAL HIGH (ref 0.1–1.0)
Monocytes Relative: 9 %
Neutro Abs: 17.4 K/uL — ABNORMAL HIGH (ref 1.7–7.7)
Neutrophils Relative %: 84 %
Platelets: 297 K/uL (ref 150–400)
RBC: 5.55 MIL/uL (ref 4.22–5.81)
RDW: 13 % (ref 11.5–15.5)
WBC: 21 K/uL — ABNORMAL HIGH (ref 4.0–10.5)
nRBC: 0 % (ref 0.0–0.2)

## 2025-01-01 LAB — COMPREHENSIVE METABOLIC PANEL WITH GFR
ALT: 23 U/L (ref 0–44)
AST: 20 U/L (ref 15–41)
Albumin: 4.3 g/dL (ref 3.5–5.0)
Alkaline Phosphatase: 96 U/L (ref 38–126)
Anion gap: 16 — ABNORMAL HIGH (ref 5–15)
BUN: 21 mg/dL (ref 8–23)
CO2: 24 mmol/L (ref 22–32)
Calcium: 9.4 mg/dL (ref 8.9–10.3)
Chloride: 93 mmol/L — ABNORMAL LOW (ref 98–111)
Creatinine, Ser: 0.94 mg/dL (ref 0.61–1.24)
GFR, Estimated: >60 mL/min
Glucose, Bld: 119 mg/dL — ABNORMAL HIGH (ref 70–99)
Potassium: 3.6 mmol/L (ref 3.5–5.1)
Sodium: 133 mmol/L — ABNORMAL LOW (ref 135–145)
Total Bilirubin: 0.6 mg/dL (ref 0.0–1.2)
Total Protein: 7.3 g/dL (ref 6.5–8.1)

## 2025-01-01 LAB — URINALYSIS, ROUTINE W REFLEX MICROSCOPIC
Glucose, UA: NEGATIVE mg/dL
Hgb urine dipstick: NEGATIVE
Ketones, ur: NEGATIVE mg/dL
Leukocytes,Ua: NEGATIVE
Nitrite: NEGATIVE
Protein, ur: 100 mg/dL — AB
Specific Gravity, Urine: >=1.03 (ref 1.005–1.030)
pH: 5.5 (ref 5.0–8.0)

## 2025-01-01 LAB — URINALYSIS, MICROSCOPIC (REFLEX)

## 2025-01-01 LAB — LIPASE, BLOOD: Lipase: 28 U/L (ref 11–51)

## 2025-01-01 LAB — LACTIC ACID, PLASMA: Lactic Acid, Venous: 1 mmol/L (ref 0.5–1.9)

## 2025-01-01 MED ORDER — HYDRALAZINE HCL 20 MG/ML IJ SOLN: 10.0000 mg | INTRAMUSCULAR | Status: DC | PRN

## 2025-01-01 MED ORDER — METOPROLOL TARTRATE 5 MG/5ML IV SOLN: 5.0000 mg | Freq: Four times a day (QID) | INTRAVENOUS | Status: DC | PRN

## 2025-01-01 MED ORDER — MENTHOL 3 MG MT LOZG
1.0000 | LOZENGE | OROMUCOSAL | Status: DC | PRN
  Administered 2025-01-02 – 2025-01-03 (×3): 3 mg via ORAL
  Filled 2025-01-01 (×4): qty 9

## 2025-01-01 MED ORDER — ENOXAPARIN SODIUM 40 MG/0.4ML IJ SOSY
40.0000 mg | PREFILLED_SYRINGE | INTRAMUSCULAR | Status: DC
  Administered 2025-01-01 – 2025-01-03 (×3): 40 mg via SUBCUTANEOUS
  Filled 2025-01-01 (×3): qty 0.4

## 2025-01-01 MED ORDER — ONDANSETRON HCL 4 MG/2ML IJ SOLN
4.0000 mg | Freq: Once | INTRAMUSCULAR | Status: AC
  Administered 2025-01-01: 4 mg via INTRAVENOUS
  Filled 2025-01-01: qty 2

## 2025-01-01 MED ORDER — SIMETHICONE 80 MG PO CHEW
80.0000 mg | CHEWABLE_TABLET | Freq: Four times a day (QID) | ORAL | Status: AC
  Administered 2025-01-01 – 2025-01-04 (×11): 80 mg via ORAL
  Filled 2025-01-01 (×12): qty 1

## 2025-01-01 MED ORDER — FAMOTIDINE IN NACL 20-0.9 MG/50ML-% IV SOLN
20.0000 mg | Freq: Two times a day (BID) | INTRAVENOUS | Status: DC
  Administered 2025-01-01 – 2025-01-04 (×7): 20 mg via INTRAVENOUS
  Filled 2025-01-01 (×8): qty 50

## 2025-01-01 MED ORDER — SALINE SPRAY 0.65 % NA SOLN: 1.0000 | Freq: Four times a day (QID) | NASAL | Status: DC | PRN

## 2025-01-01 MED ORDER — SIMETHICONE 40 MG/0.6ML PO SUSP: 80.0000 mg | Freq: Four times a day (QID) | ORAL | Status: DC | PRN

## 2025-01-01 MED ORDER — ACETAMINOPHEN 325 MG PO TABS: 325.0000 mg | ORAL_TABLET | Freq: Four times a day (QID) | ORAL | Status: DC | PRN

## 2025-01-01 MED ORDER — FAMOTIDINE IN NACL 20-0.9 MG/50ML-% IV SOLN
20.0000 mg | Freq: Once | INTRAVENOUS | Status: AC
  Administered 2025-01-01: 20 mg via INTRAVENOUS
  Filled 2025-01-01: qty 50

## 2025-01-01 MED ORDER — DIPHENHYDRAMINE HCL 50 MG/ML IJ SOLN
25.0000 mg | Freq: Once | INTRAMUSCULAR | Status: AC
  Administered 2025-01-01: 25 mg via INTRAVENOUS
  Filled 2025-01-01: qty 1

## 2025-01-01 MED ORDER — DIPHENHYDRAMINE HCL 50 MG/ML IJ SOLN: 12.5000 mg | Freq: Four times a day (QID) | INTRAMUSCULAR | Status: DC | PRN

## 2025-01-01 MED ORDER — SODIUM CHLORIDE 0.9 % IV BOLUS
1000.0000 mL | Freq: Once | INTRAVENOUS | Status: AC
  Administered 2025-01-01: 1000 mL via INTRAVENOUS

## 2025-01-01 MED ORDER — HYDROMORPHONE HCL 1 MG/ML IJ SOLN: 0.5000 mg | INTRAMUSCULAR | Status: DC | PRN

## 2025-01-01 MED ORDER — METHOCARBAMOL 1000 MG/10ML IJ SOLN: 1000.0000 mg | Freq: Four times a day (QID) | INTRAMUSCULAR | Status: DC | PRN

## 2025-01-01 MED ORDER — SODIUM CHLORIDE 0.9 % IV BOLUS: 1000.0000 mL | Freq: Three times a day (TID) | INTRAVENOUS | Status: AC | PRN

## 2025-01-01 MED ORDER — SODIUM CHLORIDE 0.9 % IV SOLN: INTRAVENOUS | Status: DC

## 2025-01-01 MED ORDER — NAPHAZOLINE-GLYCERIN 0.012-0.25 % OP SOLN: 1.0000 [drp] | Freq: Four times a day (QID) | OPHTHALMIC | Status: DC | PRN

## 2025-01-01 MED ORDER — NICOTINE 21 MG/24HR TD PT24
21.0000 mg | MEDICATED_PATCH | Freq: Once | TRANSDERMAL | Status: AC
  Administered 2025-01-01: 21 mg via TRANSDERMAL
  Filled 2025-01-01: qty 1

## 2025-01-01 MED ORDER — PROCHLORPERAZINE EDISYLATE 10 MG/2ML IJ SOLN
5.0000 mg | Freq: Once | INTRAMUSCULAR | Status: AC
  Administered 2025-01-01: 5 mg via INTRAVENOUS
  Filled 2025-01-01: qty 2

## 2025-01-01 MED ORDER — PHENOL 1.4 % MT LIQD: 2.0000 | OROMUCOSAL | Status: DC | PRN

## 2025-01-01 MED ORDER — KCL IN DEXTROSE-NACL 20-5-0.45 MEQ/L-%-% IV SOLN
INTRAVENOUS | Status: AC
  Filled 2025-01-01 (×3): qty 1000

## 2025-01-01 MED ORDER — MAGIC MOUTHWASH: 15.0000 mL | Freq: Four times a day (QID) | ORAL | Status: DC | PRN

## 2025-01-01 MED ORDER — ONDANSETRON HCL 4 MG/2ML IJ SOLN: 4.0000 mg | Freq: Four times a day (QID) | INTRAMUSCULAR | Status: DC | PRN

## 2025-01-01 MED ORDER — NICOTINE 21 MG/24HR TD PT24
21.0000 mg | MEDICATED_PATCH | Freq: Every day | TRANSDERMAL | Status: DC
  Administered 2025-01-02 – 2025-01-04 (×3): 21 mg via TRANSDERMAL
  Filled 2025-01-01 (×3): qty 1

## 2025-01-01 MED ORDER — MORPHINE SULFATE (PF) 2 MG/ML IV SOLN: 1.0000 mg | INTRAVENOUS | Status: DC | PRN

## 2025-01-01 MED ORDER — IOHEXOL 300 MG/ML  SOLN
100.0000 mL | Freq: Once | INTRAMUSCULAR | Status: AC | PRN
  Administered 2025-01-01: 100 mL via INTRAVENOUS

## 2025-01-01 NOTE — ED Provider Notes (Signed)
 Patient transferred here from [facility] for general surgery evaluation.  In short this is a 69 year old male with a past medical history of abdominal wall hernia who presented to the emergency department with nausea and vomiting.  He states that he was having some soreness to his hernia over the weekend and was seen in the ED and they were able to reduce his hernia for him on Sunday.  He states in the last 24 hours he has had increased nausea and vomiting.  Patient was hemodynamically stable on arrival in no acute distress.  His labs showed a leukocytosis and otherwise within normal range.  He had CT of his abdomen and pelvis that did show small bowel obstruction with concern for possible malrotation versus volvulus.  The patient's symptoms are controlled on his arrival here.  His abdomen is soft and nontender with reducible ventral wall hernia.  I spoke with Burnard PA with general surgery who will evaluate the patient, did request NG tube which will be placed.  2:07 PM Patient admitted by general surgery.    Kingsley, Tamakia Porto K, DO 01/01/25 1407

## 2025-01-01 NOTE — H&P (Signed)
 "    Francis Soto 23-Jun-1956  [id number].    Chief Complaint/Reason for Consult: SBO  HPI:  This is a very pleasant 69 yo white male with a history of HTN and an umbilical hernia that he has had for years.  On Saturday he was lying on his abdomen watching the football game with no issues.  About an hour after that he developed pain in his hernia.  It persisted.  He went to the ED on Sunday and found to have small bowel in this hernia.  It was reduced and he was able to go home.  He returns to the ED today with no abdominal pain but with N/V that started in the last 24 hrs.  He had a normal BM last night and last flatus was this am.  His WBC is 21K which is up from 18K on Saturday.  His hgb is also 17, most c/w dehydration possibly.  He underwent a new CT scan that reveals a SBO with mesenteric swirling concerning for a volvulus vs internal hernia.  His lactic acid is normal along with his potassium.  We have been asked to see him.  ROS: ROS: see HPI  History reviewed. No pertinent family history.  Past Medical History:  Diagnosis Date   Abnormal EKG 03/17/2017   Acute metabolic encephalopathy 02/20/2019   Altered mental status    Hypertension    Hypokalemia 02/20/2019   Hyponatremia    Metabolic acidosis, normal anion gap (NAG) 02/20/2019    Past Surgical History:  Procedure Laterality Date   HERNIA REPAIR     maybe a left inguinal hernia repair as an infant, but he isn't sure    Social History:  reports that he has been smoking cigarettes. He has a 12.5 pack-year smoking history. He has never used smokeless tobacco. He reports current alcohol use. He reports that he does not use drugs.  Allergies: Allergies[1]  (Not in a hospital admission)    Physical Exam: Blood pressure (!) 143/75, pulse 86, temperature 98.4 F (36.9 C), temperature source Oral, resp. rate 18, height 5' 9 (1.753 m), weight 97.5 kg, SpO2 97%. General: pleasant, WD, WN, mildly obese white male who is laying in  bed in NAD HEENT: head is normocephalic, atraumatic.  Sclera are noninjected.  PERRL.  Ears and nose without any masses or lesions.  Mouth is pink and dry Heart: regular, rate, and rhythm.  Normal s1,s2. No obvious murmurs, gallops, or rubs noted.  Lungs: CTAB, no wheezes, rhonchi, or rales noted.  Respiratory effort nonlabored Abd: soft, NT, mild distention, hypoactive BS, no masses or organomegaly.  Large umbilical hernia protrusion that is reducible and soft Psych: A&Ox3 with an appropriate affect.   Results for orders placed or performed during the hospital encounter of 01/01/25 (from the past 48 hours)  CBC with Differential     Status: Abnormal   Collection Time: 01/01/25  8:02 AM  Result Value Ref Range   WBC 21.0 (H) 4.0 - 10.5 K/uL   RBC 5.55 4.22 - 5.81 MIL/uL   Hemoglobin 17.6 (H) 13.0 - 17.0 g/dL   HCT 49.5 60.9 - 47.9 %   MCV 90.8 80.0 - 100.0 fL   MCH 31.7 26.0 - 34.0 pg   MCHC 34.9 30.0 - 36.0 g/dL   RDW 86.9 88.4 - 84.4 %   Platelets 297 150 - 400 K/uL   nRBC 0.0 0.0 - 0.2 %   Neutrophils Relative % 84 %   Neutro Abs 17.4 (  H) 1.7 - 7.7 K/uL   Lymphocytes Relative 7 %   Lymphs Abs 1.5 0.7 - 4.0 K/uL   Monocytes Relative 9 %   Monocytes Absolute 1.9 (H) 0.1 - 1.0 K/uL   Eosinophils Relative 0 %   Eosinophils Absolute 0.0 0.0 - 0.5 K/uL   Basophils Relative 0 %   Basophils Absolute 0.1 0.0 - 0.1 K/uL   Immature Granulocytes 0 %   Abs Immature Granulocytes 0.09 (H) 0.00 - 0.07 K/uL    Comment: Performed at [facility], [address] [place], [place] [address]  Comprehensive metabolic panel     Status: Abnormal   Collection Time: 01/01/25  8:02 AM  Result Value Ref Range   Sodium 133 (L) 135 - 145 mmol/L   Potassium 3.6 3.5 - 5.1 mmol/L   Chloride 93 (L) 98 - 111 mmol/L   CO2 24 22 - 32 mmol/L   Glucose, Bld 119 (H) 70 - 99 mg/dL    Comment: Glucose reference range applies only to samples taken after fasting for at least 8 hours.   BUN 21 8 - 23  mg/dL   Creatinine, Ser 9.05 0.61 - 1.24 mg/dL   Calcium 9.4 8.9 - 89.6 mg/dL   Total Protein 7.3 6.5 - 8.1 g/dL   Albumin 4.3 3.5 - 5.0 g/dL   AST 20 15 - 41 U/L   ALT 23 0 - 44 U/L   Alkaline Phosphatase 96 38 - 126 U/L   Total Bilirubin 0.6 0.0 - 1.2 mg/dL   GFR, Estimated >39 >39 mL/min    Comment: (NOTE) Calculated using the CKD-EPI Creatinine Equation (2021)    Anion gap 16 (H) 5 - 15    Comment: Performed at [facility], [address] [place], [place] [address]  Lipase, blood     Status: None   Collection Time: 01/01/25  8:02 AM  Result Value Ref Range   Lipase 28 11 - 51 U/L    Comment: Performed at [facility], [address] [place], [place] [address]  Urinalysis, Routine w reflex microscopic -Urine, Clean Catch     Status: Abnormal   Collection Time: 01/01/25  8:09 AM  Result Value Ref Range   Color, Urine AMBER (A) YELLOW    Comment: BIOCHEMICALS MAY BE AFFECTED BY COLOR   APPearance HAZY (A) CLEAR   Specific Gravity, Urine >=1.030 1.005 - 1.030   pH 5.5 5.0 - 8.0   Glucose, UA NEGATIVE NEGATIVE mg/dL   Hgb urine dipstick NEGATIVE NEGATIVE   Bilirubin Urine SMALL (A) NEGATIVE   Ketones, ur NEGATIVE NEGATIVE mg/dL   Protein, ur 899 (A) NEGATIVE mg/dL   Nitrite NEGATIVE NEGATIVE   Leukocytes,Ua NEGATIVE NEGATIVE    Comment: Performed at [facility], [address] [place], [place] [address]  Urinalysis, Microscopic (reflex)     Status: Abnormal   Collection Time: 01/01/25  8:09 AM  Result Value Ref Range   RBC / HPF 0-5 0 - 5 RBC/hpf   WBC, UA 0-5 0 - 5 WBC/hpf   Bacteria, UA RARE (A) NONE SEEN   Squamous Epithelial / HPF 0-5 0 - 5 /HPF   Mucus PRESENT    Hyaline Casts, UA PRESENT    Granular Casts, UA PRESENT     Comment: Performed at [facility], [address] [place], [place] [address]  Lactic acid, plasma     Status: None  Collection Time: 01/01/25 10:18 AM  Result Value Ref Range   Lactic Acid,  Venous 1.0 0.5 - 1.9 mmol/L    Comment: Performed at [facility], [address] [place], [place] [address]   CT ABDOMEN PELVIS W CONTRAST Result Date: 01/01/2025 CLINICAL DATA:  Acute generalized abdominal pain EXAM: CT ABDOMEN AND PELVIS WITH CONTRAST TECHNIQUE: Multidetector CT imaging of the abdomen and pelvis was performed using the standard protocol following bolus administration of intravenous contrast. RADIATION DOSE REDUCTION: This exam was performed according to the departmental dose-optimization program which includes automated exposure control, adjustment of the mA and/or kV according to patient size and/or use of iterative reconstruction technique. CONTRAST:  OMNIPAQUE  IOHEXOL  300 MG/ML  SOLN COMPARISON:  Two days ago FINDINGS: Lower chest: No acute abnormality. Hepatobiliary: No focal liver abnormality is seen. No gallstones, gallbladder wall thickening, or biliary dilatation. Pancreas: Unremarkable. No pancreatic ductal dilatation or surrounding inflammatory changes. Spleen: Normal in size without focal abnormality. Adrenals/Urinary Tract: Stable left adrenal adenoma. Right adrenal gland is unremarkable. Left renal cysts are unchanged. No hydronephrosis or renal obstruction is noted. Urinary bladder is unremarkable. Stomach/Bowel: Mild gastric distention is noted. Increased proximal small bowel dilatation is noted concerning for obstruction with transition zone seen centrally in the abdomen best seen on image 84 of series 5. There is noted some twisting of mesenteric structures involving the small bowel suggesting malrotation or possibly volvulus. Small bowel is no longer seen in the large ventral hernia noted on prior exam. Distal small bowel loops are nondilated. Sigmoid diverticulosis without inflammation. Appendix is unremarkable. Vascular/Lymphatic: Aortic atherosclerosis. No enlarged abdominal or pelvic lymph nodes. Reproductive: Stable mild prostatic enlargement. Other:  No ascites is noted. 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 IMPRESSION: 1. Increased proximal small bowel dilatation is noted concerning for obstruction with transition zone seen centrally in the abdomen. There is noted some twisting of mesenteric structures involving the small bowel suggesting malrotation or possibly volvulus. Small bowel is no longer seen in the large ventral hernia noted on prior exam. Critical Value/emergent results were called by telephone at the time of interpretation on 01/01/2025 at 9:45 am to provider JAYSON PEREYRA , who verbally acknowledged these results. 2. Stable left adrenal adenoma. 3. Sigmoid diverticulosis without inflammation. 4. Stable mild prostatic enlargement. 5. 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 6. Aortic atherosclerosis. Aortic Atherosclerosis (ICD10-I70.0). Electronically Signed   By: Lynwood Landy Raddle M.D.   On: 01/01/2025 09:46      Assessment/Plan SBO, possible mesenteric swirling The patient has been seen, examined, labs, vitals, chart, and imaging reviewed.  The patient has a large umbilical hernia that did have bowel in it this weekend, but this was reduced and is not currently an issue.  He does have evidence of a small bowel obstruction on his CT scan with some possible mesenteric swirling.  He does have N/V and SB dilatation and gastric dilatation.  We will place an NGT for decompression.  However, he has a completely benign abdominal exam with no pain and a normal lactate.  For now we will do the SBO protocol to see if we can get him better, but a low threshold for exploration if he worsens or fails to improve.  Discussed this plan with his sister at bedside and the patient who both agree and are happy to try to avoid surgery.    FEN - NPO/NGT/IVFs VTE - lovenox  ID -  none needed Admit - inpatient, med/surg  HTN - prn lopressor , hydralazine  while NGT  in place Umbilical hernia - monitor Tobacco abuse - nicotine  patch while here Mild obesity  I reviewed nursing notes, ED provider notes, last 24 h vitals and pain scores, last 48 h intake and output, last 24 h labs and trends, and last 24 h imaging results.  Burnard FORBES Banter, [facility] Surgery 01/01/2025, 1:37 PM Please see Amion for pager number during day hours 7:00am-4:30pm or 7:00am -11:30am on weekends      [1] No Known Allergies  "

## 2025-01-01 NOTE — ED Notes (Signed)
 [facility] at bedside

## 2025-01-01 NOTE — Consult Note (Incomplete)
 "    Francis Soto 10-19-1956  [id number].    Requesting MD: Dr. Jayson Pereyra Chief Complaint/Reason for Consult: SBO  HPI: Francis Soto is a 69 y.o. male with a hx of HTN who presented to the ED for abdominal pain.  Patient was seen in the ED on 1/11 for abdominal pain, nausea and vomiting.  He was found to have a WBC 18.7 and CT A/P with ventral abdominal wall hernia containing small bowel with SBO.  EDP reduced hernia and patient passed p.o. trial and was discharged home.  He returned to the ED today with persistent abdominal pain, nausea and vomiting.  No fevers.  No flatus or BM since***.  He is not on any blood thinners.  No prior abdominal surgeries.  No tobacco use.  His workup was concerning for SBO with twisting of mesenteric structures.  He was transferred to [facility] ED for general surgery evaluation.  ROS: ROS As above, see hpi  History reviewed. No pertinent family history.  Past Medical History:  Diagnosis Date   Abnormal EKG 03/17/2017   Acute metabolic encephalopathy 02/20/2019   Altered mental status    Hypertension    Hypokalemia 02/20/2019   Hyponatremia    Metabolic acidosis, normal anion gap (NAG) 02/20/2019    History reviewed. No pertinent surgical history.  Social History:  reports that he has been smoking cigarettes. He has a 12.5 pack-year smoking history. He has never used smokeless tobacco. He reports current alcohol use. He reports that he does not use drugs.  Allergies: Allergies[1]  (Not in a hospital admission)    Physical Exam: Blood pressure 139/80, pulse 85, temperature 98.7 F (37.1 C), resp. rate 18, height 5' 9 (1.753 m), weight 97.5 kg, SpO2 93%. *** General: pleasant, WD/WN ***male who is laying in bed in NAD HEENT: head is normocephalic, atraumatic.  Sclera are non-icteric. Ears and nose without any obvious masses or lesions.  Mouth is pink and moist. Dentition fair*** Heart: regular, rate, and rhythm.   Lungs: CTAB, no wheezes, rhonchi, or  rales noted.  Respiratory effort nonlabored Abd: *** Soft, ND, NT, +BS. No masses, hernias, or organomegaly MS: no BUE or BLE edema Skin: warm and dry  Psych: A&Ox4 with an appropriate affect*** Neuro: normal speech, thought process intact, moves all extremities, gait not assessed***   Results for orders placed or performed during the hospital encounter of 01/01/25 (from the past 48 hours)  CBC with Differential     Status: Abnormal   Collection Time: 01/01/25  8:02 AM  Result Value Ref Range   WBC 21.0 (H) 4.0 - 10.5 K/uL   RBC 5.55 4.22 - 5.81 MIL/uL   Hemoglobin 17.6 (H) 13.0 - 17.0 g/dL   HCT 49.5 60.9 - 47.9 %   MCV 90.8 80.0 - 100.0 fL   MCH 31.7 26.0 - 34.0 pg   MCHC 34.9 30.0 - 36.0 g/dL   RDW 86.9 88.4 - 84.4 %   Platelets 297 150 - 400 K/uL   nRBC 0.0 0.0 - 0.2 %   Neutrophils Relative % 84 %   Neutro Abs 17.4 (H) 1.7 - 7.7 K/uL   Lymphocytes Relative 7 %   Lymphs Abs 1.5 0.7 - 4.0 K/uL   Monocytes Relative 9 %   Monocytes Absolute 1.9 (H) 0.1 - 1.0 K/uL   Eosinophils Relative 0 %   Eosinophils Absolute 0.0 0.0 - 0.5 K/uL   Basophils Relative 0 %   Basophils Absolute 0.1 0.0 - 0.1 K/uL  Immature Granulocytes 0 %   Abs Immature Granulocytes 0.09 (H) 0.00 - 0.07 K/uL    Comment: Performed at [facility], [address] [place], [place] [address]  Comprehensive metabolic panel     Status: Abnormal   Collection Time: 01/01/25  8:02 AM  Result Value Ref Range   Sodium 133 (L) 135 - 145 mmol/L   Potassium 3.6 3.5 - 5.1 mmol/L   Chloride 93 (L) 98 - 111 mmol/L   CO2 24 22 - 32 mmol/L   Glucose, Bld 119 (H) 70 - 99 mg/dL    Comment: Glucose reference range applies only to samples taken after fasting for at least 8 hours.   BUN 21 8 - 23 mg/dL   Creatinine, Ser 9.05 0.61 - 1.24 mg/dL   Calcium 9.4 8.9 - 89.6 mg/dL   Total Protein 7.3 6.5 - 8.1 g/dL   Albumin 4.3 3.5 - 5.0 g/dL   AST 20 15 - 41 U/L   ALT 23 0 - 44 U/L   Alkaline Phosphatase 96 38 - 126  U/L   Total Bilirubin 0.6 0.0 - 1.2 mg/dL   GFR, Estimated >39 >39 mL/min    Comment: (NOTE) Calculated using the CKD-EPI Creatinine Equation (2021)    Anion gap 16 (H) 5 - 15    Comment: Performed at [facility], [address] [place], [place] [address]  Lipase, blood     Status: None   Collection Time: 01/01/25  8:02 AM  Result Value Ref Range   Lipase 28 11 - 51 U/L    Comment: Performed at [facility], [address] [place], [place] [address]  Urinalysis, Routine w reflex microscopic -Urine, Clean Catch     Status: Abnormal   Collection Time: 01/01/25  8:09 AM  Result Value Ref Range   Color, Urine AMBER (A) YELLOW    Comment: BIOCHEMICALS MAY BE AFFECTED BY COLOR   APPearance HAZY (A) CLEAR   Specific Gravity, Urine >=1.030 1.005 - 1.030   pH 5.5 5.0 - 8.0   Glucose, UA NEGATIVE NEGATIVE mg/dL   Hgb urine dipstick NEGATIVE NEGATIVE   Bilirubin Urine SMALL (A) NEGATIVE   Ketones, ur NEGATIVE NEGATIVE mg/dL   Protein, ur 899 (A) NEGATIVE mg/dL   Nitrite NEGATIVE NEGATIVE   Leukocytes,Ua NEGATIVE NEGATIVE    Comment: Performed at [facility], [address] [place], [place] [address]  Urinalysis, Microscopic (reflex)     Status: Abnormal   Collection Time: 01/01/25  8:09 AM  Result Value Ref Range   RBC / HPF 0-5 0 - 5 RBC/hpf   WBC, UA 0-5 0 - 5 WBC/hpf   Bacteria, UA RARE (A) NONE SEEN   Squamous Epithelial / HPF 0-5 0 - 5 /HPF   Mucus PRESENT    Hyaline Casts, UA PRESENT    Granular Casts, UA PRESENT     Comment: Performed at [facility], [address] [place], [place] [address]  Lactic acid, plasma     Status: None   Collection Time: 01/01/25 10:18 AM  Result Value Ref Range   Lactic Acid, Venous 1.0 0.5 - 1.9 mmol/L    Comment: Performed at [facility], [address] [place], [place] [address]   CT ABDOMEN PELVIS W CONTRAST Result Date: 01/01/2025 CLINICAL DATA:  Acute generalized abdominal pain  EXAM: CT ABDOMEN AND PELVIS WITH CONTRAST TECHNIQUE: Multidetector CT imaging of  the abdomen and pelvis was performed using the standard protocol following bolus administration of intravenous contrast. RADIATION DOSE REDUCTION: This exam was performed according to the departmental dose-optimization program which includes automated exposure control, adjustment of the mA and/or kV according to patient size and/or use of iterative reconstruction technique. CONTRAST:  OMNIPAQUE  IOHEXOL  300 MG/ML  SOLN COMPARISON:  Two days ago FINDINGS: Lower chest: No acute abnormality. Hepatobiliary: No focal liver abnormality is seen. No gallstones, gallbladder wall thickening, or biliary dilatation. Pancreas: Unremarkable. No pancreatic ductal dilatation or surrounding inflammatory changes. Spleen: Normal in size without focal abnormality. Adrenals/Urinary Tract: Stable left adrenal adenoma. Right adrenal gland is unremarkable. Left renal cysts are unchanged. No hydronephrosis or renal obstruction is noted. Urinary bladder is unremarkable. Stomach/Bowel: Mild gastric distention is noted. Increased proximal small bowel dilatation is noted concerning for obstruction with transition zone seen centrally in the abdomen best seen on image 84 of series 5. There is noted some twisting of mesenteric structures involving the small bowel suggesting malrotation or possibly volvulus. Small bowel is no longer seen in the large ventral hernia noted on prior exam. Distal small bowel loops are nondilated. Sigmoid diverticulosis without inflammation. Appendix is unremarkable. Vascular/Lymphatic: Aortic atherosclerosis. No enlarged abdominal or pelvic lymph nodes. Reproductive: Stable mild prostatic enlargement. Other: No ascites is noted. 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 IMPRESSION: 1. Increased proximal small bowel dilatation is noted concerning for obstruction with transition  zone seen centrally in the abdomen. There is noted some twisting of mesenteric structures involving the small bowel suggesting malrotation or possibly volvulus. Small bowel is no longer seen in the large ventral hernia noted on prior exam. Critical Value/emergent results were called by telephone at the time of interpretation on 01/01/2025 at 9:45 am to provider JAYSON PEREYRA , who verbally acknowledged these results. 2. Stable left adrenal adenoma. 3. Sigmoid diverticulosis without inflammation. 4. Stable mild prostatic enlargement. 5. 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 6. Aortic atherosclerosis. Aortic Atherosclerosis (ICD10-I70.0). Electronically Signed   By: Lynwood Landy Raddle M.D.   On: 01/01/2025 09:46   CT ABDOMEN PELVIS W CONTRAST Result Date: 12/30/2024 CLINICAL DATA:  Increased pain at hernia site. EXAM: CT ABDOMEN AND PELVIS WITH CONTRAST TECHNIQUE: Multidetector CT imaging of the abdomen and pelvis was performed using the standard protocol following bolus administration of intravenous contrast. RADIATION DOSE REDUCTION: This exam was performed according to the departmental dose-optimization program which includes automated exposure control, adjustment of the mA and/or kV according to patient size and/or use of iterative reconstruction technique. CONTRAST:  OMNIPAQUE  IOHEXOL  300 MG/ML  SOLN COMPARISON:  None available FINDINGS: Lower chest: Left base scarring. Subtle centrilobular ground-glass nodularity likely is indicative of respiratory bronchiolitis in this current smoker. Normal heart size without pericardial or pleural effusion. Hepatobiliary: Normal liver. Normal gallbladder, without biliary ductal dilatation. Pancreas: Normal, without mass or ductal dilatation. Spleen: Normal in size, without focal abnormality. Adrenals/Urinary Tract: Normal right adrenal gland. Left adrenal well-circumscribed 3.4 x 3.4 cm lesion measures 52 HU on portal venous phase  imaging and 31 HU on nephrographic delayed images. Left renal cysts of up to 4.0 cm do not warrant specific imaging follow-up. Normal urinary bladder. Stomach/Bowel: Normal stomach, without wall thickening. Extensive colonic diverticulosis. Wall thickening involving the sigmoid is most likely related to muscular hypertrophy on image 58/2. Normal terminal ileum and appendix. Ventral abdominal hernia contains small bowel including on image 56/2. There is edema and  small volume fluid within the hernia sac including inferiorly on image 63/2. The upstream small bowel is borderline dilated at 2.7 cm on image 51/2. The downstream small bowel is minimally decompressed. No pneumatosis. Vascular/Lymphatic: Advanced aortic and branch vessel atherosclerosis. No abdominopelvic adenopathy. Reproductive: Moderate prostatomegaly. Other: No free intraperitoneal air. 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 IMPRESSION: Ventral abdominal wall hernia contains small bowel. Given surrounding mild edema and fluid, cannot exclude strangulation. Mild upstream small bowel dilatation, consistent with low-grade obstruction. Left adrenal nodule which demonstrates washout characteristics consistent with an adenoma. No follow-up indicated. Aortic Atherosclerosis (ICD10-I70.0). Prostatomegaly. Electronically Signed   By: Rockey Kilts M.D.   On: 12/30/2024 14:33    Anti-infectives (From admission, onward)    None       Assessment/Plan SBO Patient has been seen, examined and chart has been reviewed. CT w/ SBO w/ transition central abdomen with twisting of mesenteric structures involving the small bowel c/f malrotation or volvulus. He does have a large ventral hernia but this does not contain small bowel or look like the cause of the obstruction. He is currently HDS without fever, tachycardia or hypotension. WBC 21. Lactic 1.0. On exam patient has ***. Recommend OR for exploratory laparotomy. The  planned procedure and material risks were discussed with the patient. We also discussed typical post-operative care. The patient's questions were answered to their satisfaction, they voiced understanding and elected to proceed with surgery.   FEN - NPO, NGT to LIWS VTE - SCDs ID - *** On call to OR Foley - None currently Dispo - Admit to ***.   I reviewed {Reviewed data:26882::last 24 h vitals and pain scores,last 48 h intake and output,last 24 h labs and trends,last 24 h imaging results}.  This care required {MDM levels:26883} level of medical decision making.   ***, PA-C [facility] 01/01/2025, 11:07 AM Please see Amion for pager number during day hours 7:00am-4:30pm     [1] No Known Allergies  "

## 2025-01-01 NOTE — ED Notes (Signed)
 EDP informed of unrelieved nausea

## 2025-01-01 NOTE — ED Provider Notes (Signed)
 " [place] [facility] Provider Note  CSN: [id number] Arrival date & time: 01/01/25 0720  Chief Complaint(s) Emesis  HPI Francis Soto is a 69 y.o. male with past medical history as below, significant for eustachian tube dysfunction, hyponatremia, obesity, tobacco use, abdominal hernia who presents to the ED with complaint of nausea vomiting  Patient was seen on 12/30/2024 with large abdominal hernia, this was reduced bedside by care team.  CT at that time did show some edema but hernia was reduced successfully and patient had a normal lactate.  Patient was encouraged to follow-up with general surgery in the office.  Returns today has been having persistent nausea and vomiting over the past 24 hours.  Reports that almost every hour he vomits.  He feels much better after he vomits.  He has been having regular bowel movements.  Emesis nonbloody nonbilious.  No fevers.  Some vague abdominal cramping but no significant abdominal pain, no significant pain around his hernia.  Hernia has been soft over the past 24 hours.  He is able to reduce his hernia without any discomfort.  No fevers.  No suspicious p.o. intake.  No sick contacts  Past Medical History Past Medical History:  Diagnosis Date   Abnormal EKG 03/17/2017   Acute metabolic encephalopathy 02/20/2019   Altered mental status    Hypertension    Hypokalemia 02/20/2019   Hyponatremia    Metabolic acidosis, normal anion gap (NAG) 02/20/2019   Patient Active Problem List   Diagnosis Date Noted   Recurrent acute suppurative otitis media without spontaneous rupture of left tympanic membrane 09/30/2023   PND (post-nasal drip) 09/30/2023   ETD (Eustachian tube dysfunction), bilateral 05/27/2022   Leucocytosis 02/20/2019   Insomnia 02/20/2019   Hyponatremia 02/19/2019   Chronic rhinitis 03/17/2017   Class 2 obesity without serious comorbidity with body mass index (BMI) of 35.0 to 35.9 in adult 03/17/2017    Claudication, intermittent 03/17/2017   Loss of libido 03/17/2017   Mild intermittent asthma without complication 03/17/2017   Multiple atypical nevi 03/17/2017   Tobacco use disorder, continuous 03/17/2017   Umbilical hernia without obstruction and without gangrene 03/17/2017   Essential hypertension 02/04/2016   Home Medication(s) Prior to Admission medications  Medication Sig Start Date End Date Taking? Authorizing Provider  amLODipine  (NORVASC ) 10 MG tablet TAKE 1 TABLET BY MOUTH DAILY 11/08/24   Sebastian Beverley NOVAK, MD  azelastine  (ASTELIN ) 0.1 % nasal spray Place 2 sprays into both nostrils 2 (two) times daily. 04/13/24   Sebastian Beverley NOVAK, MD  fluticasone  (FLONASE ) 50 MCG/ACT nasal spray Place 1 spray into both nostrils daily.  02/14/19   [provider]  lisinopril  (ZESTRIL ) 20 MG tablet TAKE 1 TABLET BY MOUTH DAILY 11/08/24   Sebastian Beverley NOVAK, MD  predniSONE  (DELTASONE ) 50 MG tablet Take 1 tablet daily for 5 days. 04/13/24   Sebastian Beverley NOVAK, MD  Pseudoephedrine  HCl 240 MG TB24 Take 1 tablet (240 mg total) by mouth daily. 09/30/23   Sebastian Beverley NOVAK, MD  Past Surgical History History reviewed. No pertinent surgical history. Family History History reviewed. No pertinent family history.  Social History Social History[1] Allergies Patient has no known allergies.  Review of Systems A thorough review of systems was obtained and all systems are negative except as noted in the HPI and PMH.   Physical Exam Vital Signs  I have reviewed the triage vital signs BP 139/80   Pulse 85   Temp 97.8 F (36.6 C) (Oral)   Resp 18   Ht 5' 9 (1.753 m)   Wt 97.5 kg   SpO2 93%   BMI 31.75 kg/m  Physical Exam Vitals and nursing note reviewed.  Constitutional:      General: He is not in acute distress.    Appearance: Normal appearance. He is  well-developed. He is not ill-appearing.  HENT:     Head: Normocephalic and atraumatic.     Right Ear: External ear normal.     Left Ear: External ear normal.     Nose: Nose normal.     Mouth/Throat:     Mouth: Mucous membranes are moist.  Eyes:     General: No scleral icterus.       Right eye: No discharge.        Left eye: No discharge.  Cardiovascular:     Rate and Rhythm: Normal rate.  Pulmonary:     Effort: Pulmonary effort is normal. No respiratory distress.     Breath sounds: No stridor.  Abdominal:     General: Abdomen is flat. There is no distension.     Palpations: Abdomen is soft.     Tenderness: There is no abdominal tenderness. There is no guarding.     Hernia: A hernia is present.   Musculoskeletal:        General: No deformity.     Cervical back: No rigidity.  Skin:    General: Skin is warm and dry.     Coloration: Skin is not cyanotic, jaundiced or pale.  Neurological:     Mental Status: He is alert.  Psychiatric:        Speech: Speech normal.        Behavior: Behavior normal. Behavior is cooperative.     ED Results and Treatments Labs (all labs ordered are listed, but only abnormal results are displayed) Labs Reviewed  CBC WITH DIFFERENTIAL/PLATELET - Abnormal; Notable for the following components:      Result Value   WBC 21.0 (*)    Hemoglobin 17.6 (*)    Neutro Abs 17.4 (*)    Monocytes Absolute 1.9 (*)    Abs Immature Granulocytes 0.09 (*)    All other components within normal limits  COMPREHENSIVE METABOLIC PANEL WITH GFR - Abnormal; Notable for the following components:   Sodium 133 (*)    Chloride 93 (*)    Glucose, Bld 119 (*)    Anion gap 16 (*)    All other components within normal limits  URINALYSIS, ROUTINE W REFLEX MICROSCOPIC - Abnormal; Notable for the following components:   Color, Urine AMBER (*)    APPearance HAZY (*)    Bilirubin Urine SMALL (*)    Protein, ur 100 (*)    All other components within normal limits   URINALYSIS, MICROSCOPIC (REFLEX) - Abnormal; Notable for the following components:   Bacteria, UA RARE (*)    All other components within normal limits  LIPASE, BLOOD  LACTIC ACID, PLASMA  LACTIC ACID, PLASMA  Radiology CT ABDOMEN PELVIS W CONTRAST Result Date: 01/01/2025 CLINICAL DATA:  Acute generalized abdominal pain EXAM: CT ABDOMEN AND PELVIS WITH CONTRAST TECHNIQUE: Multidetector CT imaging of the abdomen and pelvis was performed using the standard protocol following bolus administration of intravenous contrast. RADIATION DOSE REDUCTION: This exam was performed according to the departmental dose-optimization program which includes automated exposure control, adjustment of the mA and/or kV according to patient size and/or use of iterative reconstruction technique. CONTRAST:  OMNIPAQUE  IOHEXOL  300 MG/ML  SOLN COMPARISON:  Two days ago FINDINGS: Lower chest: No acute abnormality. Hepatobiliary: No focal liver abnormality is seen. No gallstones, gallbladder wall thickening, or biliary dilatation. Pancreas: Unremarkable. No pancreatic ductal dilatation or surrounding inflammatory changes. Spleen: Normal in size without focal abnormality. Adrenals/Urinary Tract: Stable left adrenal adenoma. Right adrenal gland is unremarkable. Left renal cysts are unchanged. No hydronephrosis or renal obstruction is noted. Urinary bladder is unremarkable. Stomach/Bowel: Mild gastric distention is noted. Increased proximal small bowel dilatation is noted concerning for obstruction with transition zone seen centrally in the abdomen best seen on image 84 of series 5. There is noted some twisting of mesenteric structures involving the small bowel suggesting malrotation or possibly volvulus. Small bowel is no longer seen in the large ventral hernia noted on prior exam. Distal small bowel loops are  nondilated. Sigmoid diverticulosis without inflammation. Appendix is unremarkable. Vascular/Lymphatic: Aortic atherosclerosis. No enlarged abdominal or pelvic lymph nodes. Reproductive: Stable mild prostatic enlargement. Other: No ascites is noted. 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 IMPRESSION: 1. Increased proximal small bowel dilatation is noted concerning for obstruction with transition zone seen centrally in the abdomen. There is noted some twisting of mesenteric structures involving the small bowel suggesting malrotation or possibly volvulus. Small bowel is no longer seen in the large ventral hernia noted on prior exam. Critical Value/emergent results were called by telephone at the time of interpretation on 01/01/2025 at 9:45 am to provider JAYSON PEREYRA , who verbally acknowledged these results. 2. Stable left adrenal adenoma. 3. Sigmoid diverticulosis without inflammation. 4. Stable mild prostatic enlargement. 5. 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 6. Aortic atherosclerosis. Aortic Atherosclerosis (ICD10-I70.0). Electronically Signed   By: Lynwood Landy Raddle M.D.   On: 01/01/2025 09:46    Pertinent labs & imaging results that were available during my care of the patient were reviewed by me and considered in my medical decision making (see MDM for details).  Medications Ordered in ED Medications  sodium chloride  0.9 % bolus 1,000 mL (0 mLs Intravenous Stopped 01/01/25 1019)  ondansetron  (ZOFRAN ) injection 4 mg (4 mg Intravenous Given 01/01/25 0817)  iohexol  (OMNIPAQUE ) 300 MG/ML solution 100 mL (100 mLs Intravenous Contrast Given 01/01/25 0911)  prochlorperazine  (COMPAZINE ) injection 5 mg (5 mg Intravenous Given 01/01/25 0855)  diphenhydrAMINE  (BENADRYL ) injection 25 mg (25 mg Intravenous Given 01/01/25 0853)  famotidine  (PEPCID ) IVPB 20 mg premix (0 mg Intravenous Stopped 01/01/25 0959)  Procedures Procedures  (including critical care time)  Medical Decision Making / ED Course    Medical Decision Making:    Kaiser Belluomini is a 69 y.o. male with past medical history as below, significant for eustachian tube dysfunction, hyponatremia, obesity, tobacco use, abdominal hernia who presents to the ED with complaint of nausea vomiting. The complaint involves an extensive differential diagnosis and also carries with it a high risk of complications and morbidity.  Serious etiology was considered. Ddx includes but is not limited to: Differential diagnosis includes but is not exclusive to acute cholecystitis, intrathoracic causes for epigastric abdominal pain, gastritis, duodenitis, pancreatitis, small bowel or large bowel obstruction, abdominal aortic aneurysm, hernia, gastritis, etc.   Complete initial physical exam performed, notably the patient was in no acute distress.    Reviewed and confirmed nursing documentation for past medical history, family history, social history.  Vital signs reviewed.    Nausea and vomiting Bowel obstruction, ?volvulus> - Persistent nausea and vomiting over the past 24 hours, recent evaluation 2 days ago.  He had leukocytosis, normal lactic acid.  CT imaging reviewed - Abdomen soft, hernia is easily reducible on exam.  He is HDS - Fluids, antiemetic - Labs > wbc ++ - CT abdomen pelvis repeat - feeling better, no vomiting while in ED - he is HDS, stable for transfer   Clinical Course as of 01/01/25 1033  Tue Jan 01, 2025  0954 Spoke with radiology, CT today is concerning for bowel obstruction, possible volvulus or malrotation.  Will consult general surgery [SG]  1002 Send to [facility] [SG]    Clinical Course User Index [SG] Elnor Jayson LABOR, DO     Spoke with general surgery, PA Burnard.  Recommend patient be transferred to [facility], ED for evaluation Spoke with Dr Gennaro regarding transfer Pt/family agreeable to plan for transfer.               Additional history obtained: -Additional history obtained from family -External records from outside source obtained and reviewed including: Chart review including previous notes, labs, imaging, consultation notes including  Recent ER visit, recent CT   Lab Tests: -I ordered, reviewed, and interpreted labs.   The pertinent results include:   Labs Reviewed  CBC WITH DIFFERENTIAL/PLATELET - Abnormal; Notable for the following components:      Result Value   WBC 21.0 (*)    Hemoglobin 17.6 (*)    Neutro Abs 17.4 (*)    Monocytes Absolute 1.9 (*)    Abs Immature Granulocytes 0.09 (*)    All other components within normal limits  COMPREHENSIVE METABOLIC PANEL WITH GFR - Abnormal; Notable for the following components:   Sodium 133 (*)    Chloride 93 (*)    Glucose, Bld 119 (*)    Anion gap 16 (*)    All other components within normal limits  URINALYSIS, ROUTINE W REFLEX MICROSCOPIC - Abnormal; Notable for the following components:   Color, Urine AMBER (*)    APPearance HAZY (*)    Bilirubin Urine SMALL (*)    Protein, ur 100 (*)    All other components within normal limits  URINALYSIS, MICROSCOPIC (REFLEX) - Abnormal; Notable for the following components:   Bacteria, UA RARE (*)    All other components within normal limits  LIPASE, BLOOD  LACTIC ACID, PLASMA  LACTIC ACID, PLASMA    Notable for leukocytosis  EKG   EKG Interpretation Date/Time:  Tuesday January 01 2025 09:03:55 EST Ventricular Rate:  37  PR Interval:  172 QRS Duration:  121 QT Interval:  387 QTC Calculation: 455 R Axis:   -41  Text Interpretation: Sinus rhythm Nonspecific IVCD with LAD Anteroseptal infarct, old Nonspecific T abnormalities, lateral leads Interpretation limited secondary to artifact Confirmed by Elnor Savant ([id number]) on 01/01/2025 9:58:12 AM          Imaging Studies ordered: I ordered imaging studies including CTAP I independently visualized the following imaging with scope of interpretation limited to determining acute life threatening conditions related to emergency care; findings noted above I agree with the radiologist interpretation If any imaging was obtained with contrast I closely monitored patient for any possible adverse reaction a/w contrast administration in the emergency department   Medicines ordered and prescription drug management: Meds ordered this encounter  Medications   sodium chloride  0.9 % bolus 1,000 mL   ondansetron  (ZOFRAN ) injection 4 mg   iohexol  (OMNIPAQUE ) 300 MG/ML solution 100 mL   prochlorperazine  (COMPAZINE ) injection 5 mg   diphenhydrAMINE  (BENADRYL ) injection 25 mg   famotidine  (PEPCID ) IVPB 20 mg premix    -I have reviewed the patients home medicines and have made adjustments as needed   Consultations Obtained: I requested consultation with the neurosurgery,  and discussed lab and imaging findings as well as pertinent plan - they recommend: Send to [facility] ED   Cardiac Monitoring: Continuous pulse oximetry interpreted by myself, 97% on RA.    Social Determinants of Health:  Diagnosis or treatment significantly limited by social determinants of health: current smoker and obesity   Reevaluation: After the interventions noted above, I reevaluated the patient and found that they have improved  Co morbidities that complicate the patient evaluation  Past Medical History:  Diagnosis Date   Abnormal EKG 03/17/2017   Acute metabolic encephalopathy 02/20/2019   Altered mental status    Hypertension    Hypokalemia 02/20/2019   Hyponatremia    Metabolic acidosis, normal anion gap (NAG) 02/20/2019      Dispostion: Disposition decision including need for hospitalization was considered, and patient admitted to the hospital.    Final Clinical Impression(s) / ED Diagnoses Final diagnoses:   Partial intestinal obstruction, unspecified cause (HCC)  Diverticulosis  Aortic atherosclerosis  Nausea and vomiting, unspecified vomiting type         [1]  Social History Tobacco Use   Smoking status: Every Day    Current packs/day: 0.50    Average packs/day: 0.5 packs/day for 25.0 years (12.5 ttl pk-yrs)    Types: Cigarettes   Smokeless tobacco: Never  Vaping Use   Vaping status: Never Used  Substance Use Topics   Alcohol use: Yes    Comment: occ   Drug use: Never     Elnor Savant LABOR, DO 01/01/25 1034  "

## 2025-01-01 NOTE — ED Triage Notes (Signed)
 N/V x 2 days, denies abd pain.

## 2025-01-01 NOTE — ED Notes (Signed)
 Pt requesting water, informed of NPO status. Pt provided oral swab to moisten mouth.

## 2025-01-02 ENCOUNTER — Inpatient Hospital Stay (HOSPITAL_COMMUNITY): Payer: Medicare (Managed Care)

## 2025-01-02 LAB — BASIC METABOLIC PANEL WITH GFR
Anion gap: 10 (ref 5–15)
BUN: 18 mg/dL (ref 8–23)
CO2: 24 mmol/L (ref 22–32)
Calcium: 8.5 mg/dL — ABNORMAL LOW (ref 8.9–10.3)
Chloride: 102 mmol/L (ref 98–111)
Creatinine, Ser: 0.88 mg/dL (ref 0.61–1.24)
GFR, Estimated: >60 mL/min
Glucose, Bld: 101 mg/dL — ABNORMAL HIGH (ref 70–99)
Potassium: 3.4 mmol/L — ABNORMAL LOW (ref 3.5–5.1)
Sodium: 135 mmol/L (ref 135–145)

## 2025-01-02 LAB — PHOSPHORUS: Phosphorus: 2.7 mg/dL (ref 2.5–4.6)

## 2025-01-02 LAB — CBC
HCT: 46 % (ref 39.0–52.0)
Hemoglobin: 15.6 g/dL (ref 13.0–17.0)
MCH: 31.5 pg (ref 26.0–34.0)
MCHC: 33.9 g/dL (ref 30.0–36.0)
MCV: 92.9 fL (ref 80.0–100.0)
Platelets: 245 K/uL (ref 150–400)
RBC: 4.95 MIL/uL (ref 4.22–5.81)
RDW: 13.1 % (ref 11.5–15.5)
WBC: 14.9 K/uL — ABNORMAL HIGH (ref 4.0–10.5)
nRBC: 0 % (ref 0.0–0.2)

## 2025-01-02 LAB — PREALBUMIN: Prealbumin: 17 mg/dL — ABNORMAL LOW (ref 18–38)

## 2025-01-02 LAB — MAGNESIUM: Magnesium: 2.2 mg/dL (ref 1.7–2.4)

## 2025-01-02 LAB — HIV ANTIBODY (ROUTINE TESTING W REFLEX): HIV Screen 4th Generation wRfx: NONREACTIVE

## 2025-01-02 MED ORDER — POTASSIUM CHLORIDE 10 MEQ/100ML IV SOLN
10.0000 meq | INTRAVENOUS | Status: AC
  Administered 2025-01-02 (×4): 10 meq via INTRAVENOUS
  Filled 2025-01-02 (×4): qty 100

## 2025-01-02 MED ORDER — METOPROLOL TARTRATE 5 MG/5ML IV SOLN: 5.0000 mg | Freq: Four times a day (QID) | INTRAVENOUS | Status: DC | PRN

## 2025-01-02 MED ORDER — ENALAPRILAT 1.25 MG/ML IV SOLN: 0.6250 mg | Freq: Four times a day (QID) | INTRAVENOUS | Status: DC | PRN

## 2025-01-02 MED ORDER — DIATRIZOATE MEGLUMINE & SODIUM 66-10 % PO SOLN
90.0000 mL | Freq: Once | ORAL | Status: AC
  Administered 2025-01-02: 90 mL via NASOGASTRIC
  Filled 2025-01-02: qty 90

## 2025-01-02 NOTE — Progress Notes (Signed)
 01/02/2025  Francis Soto [id number] [birth year]  CARE TEAM: PCP: Sebastian Beverley NOVAK, MD  Outpatient Care Team: Patient Care Team: Sebastian Beverley NOVAK, MD as PCP - General (Family Medicine)  Inpatient Treatment Team: Treatment Team:  [place], [place], MD Ccs, Md, MD Trudy Sharlet PARAS, RN Estelle Hunter DEL, RN Kusnitz, Devere POUR, RN Gladis Fredie CROME, NT   Problem List:   Principal Problem:   SBO (small bowel obstruction) [facility]) Active Problems:   Ventral hernia   * No surgery found *      Assessment [facility] Stay = 1 days)      Small bowel obstructions most likely due to adhesions.  Improving    Plan:  Continue NG tube for now.  Small bowel protocol with contrast.  Because he has less output from the NG tube and thinks he may having some flatus and abdomen is much less tender, hopefully he will fly quickly we will see.  If he does not open up in 48 hours, may require operative intervention.  Patient has large but soft and reducible periumbilical ventral hernia.  Given the fact he has had prior small bowel incarceration and this, I think at some point he would benefit from surgery to fix this.  However this is not an urgent issue and I would hold off on trying to do more urgent hernia repair.  Would better to delay this since he is going to need some mesh reinforcement to avoid recurrence.  -monitor electrolytes & replace as needed  Keep K>4, Mg>2, Phos>3  Hypertension.  Fair control.  Increase PRN (as needed) medications.  Not able to take orally for now  -VTE prophylaxis- SCDs.  Anticoagulation prophyllaxis SQ as appropriate  -mobilize as tolerated to help recovery.  Enlist therapies in moderate/high risk patients as appropriate  I updated the patient's status to the patient and nurse  Recommendations were made.  Questions were answered.  He expressed understanding & appreciation.  -Disposition: TBD      I reviewed nursing notes, last 24 h vitals and pain  scores, last 48 h intake and output, last 24 h labs and trends, and last 24 h imaging results.  I have reviewed this patient's available data, including medical history, events of note, test results, etc as part of my evaluation.   A significant portion of that time was spent in counseling. Care during the described time interval was provided by me.  This care required moderate level of medical decision making.  01/02/2025    Subjective: (Chief complaint)  Patient in good spirits.  Not having any abdominal pain.  Thinks he might of had a little flatus.  Nurse in room.  Still moderate amount in canister.  Wanting to get up and down.  Objective:  Vital signs:  Vitals:   01/01/25 1645 01/01/25 2015 01/02/25 0144 01/02/25 0625  BP: (!) 131/110 106/67 (!) 145/87 (!) 148/77  Pulse: 86 80 78 76  Resp: 18 16 18 18   Temp:  99.6 F (37.6 C) 99.2 F (37.3 C) 98.9 F (37.2 C)  TempSrc:  Oral Oral Oral  SpO2: 97% 95% 94% 93%  Weight:      Height:        Last BM Date : 12/31/24  Intake/Output   Yesterday:  01/13 0701 - 01/14 0700 In: 2107.6 [I.V.:942.6; NG/GT:65; IV Piggyback:1100] Out: 525 [Urine:300; Emesis/NG output:225] This shift:  No intake/output data recorded.  Bowel function:  Flatus: Scant  BM:  No  Drain: NG tube with thick  and dark brown bilious effluent in canister   Physical Exam:  General: Pt awake/alert in no acute distress.  Chatty and smiling.  Nontoxic.  Not sickly. Eyes: PERRL, normal EOM.  Sclera clear.  No icterus Neuro: CN II-XII intact w/o focal sensory/motor deficits. Lymph: No head/neck/groin lymphadenopathy Psych:  No delerium/psychosis/paranoia.  Oriented x 4 HENT: Normocephalic, Mucus membranes moist.  No thrush Neck: Supple, No tracheal deviation.  No obvious thyromegaly Chest: No pain to chest wall compression.  Good respiratory excursion.  No audible wheezing CV:  Pulses intact.  Regular rhythm.  No major extremity edema MS: Normal AROM  mjr joints.  No obvious deformity  Abdomen: Soft.  Moderately distended.  Nontender.  No evidence of peritonitis.  No incarcerated hernias.  Ext:  No deformity.  No mjr edema.  No cyanosis Skin: No petechiae / purpurea.  No major sores.  Warm and dry    Results:   Cultures: No results found for this or any previous visit (from the past 720 hours).  Labs: Results for orders placed or performed during the hospital encounter of 01/01/25 (from the past 48 hours)  CBC with Differential     Status: Abnormal   Collection Time: 01/01/25  8:02 AM  Result Value Ref Range   WBC 21.0 (H) 4.0 - 10.5 K/uL   RBC 5.55 4.22 - 5.81 MIL/uL   Hemoglobin 17.6 (H) 13.0 - 17.0 g/dL   HCT 49.5 60.9 - 47.9 %   MCV 90.8 80.0 - 100.0 fL   MCH 31.7 26.0 - 34.0 pg   MCHC 34.9 30.0 - 36.0 g/dL   RDW 86.9 88.4 - 84.4 %   Platelets 297 150 - 400 K/uL   nRBC 0.0 0.0 - 0.2 %   Neutrophils Relative % 84 %   Neutro Abs 17.4 (H) 1.7 - 7.7 K/uL   Lymphocytes Relative 7 %   Lymphs Abs 1.5 0.7 - 4.0 K/uL   Monocytes Relative 9 %   Monocytes Absolute 1.9 (H) 0.1 - 1.0 K/uL   Eosinophils Relative 0 %   Eosinophils Absolute 0.0 0.0 - 0.5 K/uL   Basophils Relative 0 %   Basophils Absolute 0.1 0.0 - 0.1 K/uL   Immature Granulocytes 0 %   Abs Immature Granulocytes 0.09 (H) 0.00 - 0.07 K/uL    Comment: Performed at [facility], [address] [place], [place] [address]  Comprehensive metabolic panel     Status: Abnormal   Collection Time: 01/01/25  8:02 AM  Result Value Ref Range   Sodium 133 (L) 135 - 145 mmol/L   Potassium 3.6 3.5 - 5.1 mmol/L   Chloride 93 (L) 98 - 111 mmol/L   CO2 24 22 - 32 mmol/L   Glucose, Bld 119 (H) 70 - 99 mg/dL    Comment: Glucose reference range applies only to samples taken after fasting for at least 8 hours.   BUN 21 8 - 23 mg/dL   Creatinine, Ser 9.05 0.61 - 1.24 mg/dL   Calcium 9.4 8.9 - 89.6 mg/dL   Total Protein 7.3 6.5 - 8.1 g/dL   Albumin 4.3 3.5 - 5.0 g/dL    AST 20 15 - 41 U/L   ALT 23 0 - 44 U/L   Alkaline Phosphatase 96 38 - 126 U/L   Total Bilirubin 0.6 0.0 - 1.2 mg/dL   GFR, Estimated >39 >39 mL/min    Comment: (NOTE) Calculated using the CKD-EPI Creatinine Equation (2021)    Anion gap 16 (  H) 5 - 15    Comment: Performed at [facility], [address] [place], [place] [address]  Lipase, blood     Status: None   Collection Time: 01/01/25  8:02 AM  Result Value Ref Range   Lipase 28 11 - 51 U/L    Comment: Performed at [facility], [address] [place], [place] [address]  Urinalysis, Routine w reflex microscopic -Urine, Clean Catch     Status: Abnormal   Collection Time: 01/01/25  8:09 AM  Result Value Ref Range   Color, Urine AMBER (A) YELLOW    Comment: BIOCHEMICALS MAY BE AFFECTED BY COLOR   APPearance HAZY (A) CLEAR   Specific Gravity, Urine >=1.030 1.005 - 1.030   pH 5.5 5.0 - 8.0   Glucose, UA NEGATIVE NEGATIVE mg/dL   Hgb urine dipstick NEGATIVE NEGATIVE   Bilirubin Urine SMALL (A) NEGATIVE   Ketones, ur NEGATIVE NEGATIVE mg/dL   Protein, ur 899 (A) NEGATIVE mg/dL   Nitrite NEGATIVE NEGATIVE   Leukocytes,Ua NEGATIVE NEGATIVE    Comment: Performed at [facility], [address] [place], [place] [address]  Urinalysis, Microscopic (reflex)     Status: Abnormal   Collection Time: 01/01/25  8:09 AM  Result Value Ref Range   RBC / HPF 0-5 0 - 5 RBC/hpf   WBC, UA 0-5 0 - 5 WBC/hpf   Bacteria, UA RARE (A) NONE SEEN   Squamous Epithelial / HPF 0-5 0 - 5 /HPF   Mucus PRESENT    Hyaline Casts, UA PRESENT    Granular Casts, UA PRESENT     Comment: Performed at [facility], [address] [place], [place] [address]  Lactic acid, plasma     Status: None   Collection Time: 01/01/25 10:18 AM  Result Value Ref Range   Lactic Acid, Venous 1.0 0.5 - 1.9 mmol/L    Comment: Performed at [facility], [address] [place], [place] [address]  Basic metabolic panel      Status: Abnormal   Collection Time: 01/02/25  3:31 AM  Result Value Ref Range   Sodium 135 135 - 145 mmol/L   Potassium 3.4 (L) 3.5 - 5.1 mmol/L   Chloride 102 98 - 111 mmol/L   CO2 24 22 - 32 mmol/L   Glucose, Bld 101 (H) 70 - 99 mg/dL    Comment: Glucose reference range applies only to samples taken after fasting for at least 8 hours.   BUN 18 8 - 23 mg/dL   Creatinine, Ser 9.11 0.61 - 1.24 mg/dL   Calcium 8.5 (L) 8.9 - 10.3 mg/dL   GFR, Estimated >39 >39 mL/min    Comment: (NOTE) Calculated using the CKD-EPI Creatinine Equation (2021)    Anion gap 10 5 - 15    Comment: Performed at [facility], [address], [place], [place] [address]  CBC     Status: Abnormal   Collection Time: 01/02/25  3:31 AM  Result Value Ref Range   WBC 14.9 (H) 4.0 - 10.5 K/uL   RBC 4.95 4.22 - 5.81 MIL/uL   Hemoglobin 15.6 13.0 - 17.0 g/dL   HCT 53.9 60.9 - 47.9 %   MCV 92.9 80.0 - 100.0 fL   MCH 31.5 26.0 - 34.0 pg   MCHC 33.9 30.0 - 36.0 g/dL   RDW 86.8 88.4 - 84.4 %   Platelets 245 150 - 400 K/uL  nRBC 0.0 0.0 - 0.2 %    Comment: Performed at [facility], [address], [place], [place] [address]    Imaging / Studies: DG Abd Portable 1 View Result Date: 01/01/2025 CLINICAL DATA:  Nasogastric tube placement EXAM: PORTABLE ABDOMEN - 1 VIEW COMPARISON:  None Available. FINDINGS: Nasogastric tube tip is seen in proximal stomach. IMPRESSION: Nasogastric tube tip seen in proximal stomach. Electronically Signed   By: Francis Landy Raddle M.D.   On: 01/01/2025 15:17   CT ABDOMEN PELVIS W CONTRAST Result Date: 01/01/2025 CLINICAL DATA:  Acute generalized abdominal pain EXAM: CT ABDOMEN AND PELVIS WITH CONTRAST TECHNIQUE: Multidetector CT imaging of the abdomen and pelvis was performed using the standard protocol following bolus administration of intravenous contrast. RADIATION DOSE REDUCTION: This exam was performed according to the departmental dose-optimization  program which includes automated exposure control, adjustment of the mA and/or kV according to patient size and/or use of iterative reconstruction technique. CONTRAST:  OMNIPAQUE  IOHEXOL  300 MG/ML  SOLN COMPARISON:  Two days ago FINDINGS: Lower chest: No acute abnormality. Hepatobiliary: No focal liver abnormality is seen. No gallstones, gallbladder wall thickening, or biliary dilatation. Pancreas: Unremarkable. No pancreatic ductal dilatation or surrounding inflammatory changes. Spleen: Normal in size without focal abnormality. Adrenals/Urinary Tract: Stable left adrenal adenoma. Right adrenal gland is unremarkable. Left renal cysts are unchanged. No hydronephrosis or renal obstruction is noted. Urinary bladder is unremarkable. Stomach/Bowel: Mild gastric distention is noted. Increased proximal small bowel dilatation is noted concerning for obstruction with transition zone seen centrally in the abdomen best seen on image 84 of series 5. There is noted some twisting of mesenteric structures involving the small bowel suggesting malrotation or possibly volvulus. Small bowel is no longer seen in the large ventral hernia noted on prior exam. Distal small bowel loops are nondilated. Sigmoid diverticulosis without inflammation. Appendix is unremarkable. Vascular/Lymphatic: Aortic atherosclerosis. No enlarged abdominal or pelvic lymph nodes. Reproductive: Stable mild prostatic enlargement. Other: No ascites is noted. 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 IMPRESSION: 1. Increased proximal small bowel dilatation is noted concerning for obstruction with transition zone seen centrally in the abdomen. There is noted some twisting of mesenteric structures involving the small bowel suggesting malrotation or possibly volvulus. Small bowel is no longer seen in the large ventral hernia noted on prior exam. Critical Value/emergent results were called by telephone at  the time of interpretation on 01/01/2025 at 9:45 am to provider JAYSON PEREYRA , who verbally acknowledged these results. 2. Stable left adrenal adenoma. 3. Sigmoid diverticulosis without inflammation. 4. Stable mild prostatic enlargement. 5. 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 6. Aortic atherosclerosis. Aortic Atherosclerosis (ICD10-I70.0). Electronically Signed   By: Francis Landy Raddle M.D.   On: 01/01/2025 09:46    Medications / Allergies: per chart  Antibiotics: Anti-infectives (From admission, onward)    None         Note: Portions of this report may have been transcribed using voice recognition software. Every effort was made to ensure accuracy; however, inadvertent computerized transcription errors may be present.   Any transcriptional errors that result from this process are unintentional.    Elspeth KYM Schultze, MD, FACS, MASCRS Esophageal, Gastrointestinal & Colorectal Surgery Robotic and Minimally Invasive Surgery  [facility] [address], [address] [place], [place] [address] [phone] Fax [phone] Main  CONTACT INFORMATION: Weekday (9AM-5PM): Call [facility] main office at  [phone] Weeknight (5PM-9AM) or Weekend/Holiday: Check EPIC Web Links tab & use AMION (password  [email or web address]) for General Surgery [facility] coverage  Please, DO NOT use SecureChat  (it is not reliable communication to reach operating surgeons & will lead to a delay in care).   Epic staff messaging available for outpatient concerns needing 1-2 business day response.      01/02/2025  8:17 AM

## 2025-01-02 NOTE — Plan of Care (Signed)

## 2025-01-03 MED ORDER — LISINOPRIL 20 MG PO TABS
20.0000 mg | ORAL_TABLET | Freq: Every day | ORAL | Status: DC
  Administered 2025-01-04: 20 mg via ORAL
  Filled 2025-01-03: qty 1

## 2025-01-03 MED ORDER — AMLODIPINE BESYLATE 10 MG PO TABS
10.0000 mg | ORAL_TABLET | Freq: Every day | ORAL | Status: DC
  Administered 2025-01-04: 10 mg via ORAL
  Filled 2025-01-03: qty 1

## 2025-01-03 MED ORDER — AZELASTINE HCL 0.1 % NA SOLN
2.0000 | Freq: Two times a day (BID) | NASAL | Status: DC
  Administered 2025-01-03 – 2025-01-04 (×3): 2 via NASAL
  Filled 2025-01-03: qty 30

## 2025-01-03 NOTE — Plan of Care (Signed)
  Problem: Education: Goal: Knowledge of General Education information will improve Description: Including pain rating scale, medication(s)/side effects and non-pharmacologic comfort measures Outcome: Progressing   Problem: Activity: Goal: Risk for activity intolerance will decrease Outcome: Progressing   Problem: Pain Managment: Goal: General experience of comfort will improve and/or be controlled Outcome: Progressing

## 2025-01-03 NOTE — Progress Notes (Signed)
 "      Subjective: Patient feels great.  NGT has essentially fallen out.  Moving his bowels numerous times.  No pain, no N/V  ROS: See above, otherwise other systems negative  Objective: Vital signs in last 24 hours: Temp:  [99.2 F (37.3 C)-99.4 F (37.4 C)] 99.2 F (37.3 C) (01/14 2128) Pulse Rate:  [83-85] 83 (01/14 2128) Resp:  [15-20] 15 (01/14 2128) BP: (139-141)/(82-83) 141/82 (01/14 2128) SpO2:  [95 %-96 %] 96 % (01/14 2128) Last BM Date : 01/01/25  Intake/Output from previous day: 01/14 0701 - 01/15 0700 In: 1570.9 [I.V.:1054.8; IV Piggyback:516.1] Out: 1300 [Urine:200; Emesis/NG output:1100] Intake/Output this shift: No intake/output data recorded.  PE: Abd: soft, NT, ND, umbilical hernia stable and present  Lab Results:  Recent Labs    01/01/25 0802 01/02/25 0331  WBC 21.0* 14.9*  HGB 17.6* 15.6  HCT 50.4 46.0  PLT 297 245   BMET Recent Labs    01/01/25 0802 01/02/25 0331  NA 133* 135  K 3.6 3.4*  CL 93* 102  CO2 24 24  GLUCOSE 119* 101*  BUN 21 18  CREATININE 0.94 0.88  CALCIUM 9.4 8.5*   PT/INR No results for input(s): LABPROT, INR in the last 72 hours. CMP     Component Value Date/Time   NA 135 01/02/2025 0331   K 3.4 (L) 01/02/2025 0331   CL 102 01/02/2025 0331   CO2 24 01/02/2025 0331   GLUCOSE 101 (H) 01/02/2025 0331   BUN 18 01/02/2025 0331   CREATININE 0.88 01/02/2025 0331   CALCIUM 8.5 (L) 01/02/2025 0331   PROT 7.3 01/01/2025 0802   ALBUMIN 4.3 01/01/2025 0802   AST 20 01/01/2025 0802   ALT 23 01/01/2025 0802   ALKPHOS 96 01/01/2025 0802   BILITOT 0.6 01/01/2025 0802   GFRNONAA >60 01/02/2025 0331   GFRAA >60 02/21/2019 0827   Lipase     Component Value Date/Time   LIPASE 28 01/01/2025 0802       Studies/Results: DG Abd Portable 1V-Small Bowel Obstruction Protocol-initial, 8 hr delay Result Date: 01/02/2025 EXAM: 1 VIEW XRAY OF THE ABDOMEN 01/02/2025 04:36:00 PM COMPARISON: xr abd 01/01/2025 CLINICAL  HISTORY: FINDINGS: LINES, TUBES AND DEVICES: Enteric tube stable in place within the stomach. BOWEL: Decreased gaseous distension of small bowel in the central abdomen. Po contrast reaches the rectum. SOFT TISSUES: No abnormal calcifications. BONES: No acute fracture. IMPRESSION: 1. Decreased gaseous distension of small bowel, with oral contrast reaching the rectum, suggesting improving or parital bowel obstruction. 2. Enteric tube terminates within the stomach. Electronically signed by: Morgane Naveau MD 01/02/2025 05:22 PM EST RP Workstation: [id number]   DG Abd Portable 1 View Result Date: 01/01/2025 CLINICAL DATA:  Nasogastric tube placement EXAM: PORTABLE ABDOMEN - 1 VIEW COMPARISON:  None Available. FINDINGS: Nasogastric tube tip is seen in proximal stomach. IMPRESSION: Nasogastric tube tip seen in proximal stomach. Electronically Signed   By: Lynwood Landy Raddle M.D.   On: 01/01/2025 15:17    Anti-infectives: Anti-infectives (From admission, onward)    None        Assessment/Plan SBO -resolving.  8-hr delay film reviewed with contrast in his colon and moving his bowels numerous times -DC NGT -FLD, likely solid food in am and DC tomorrow if doing well   FEN - FLD VTE - lovenox  ID - none  HTN - resume home meds Umbilical hernia - monitor Tobacco abuse - nicotine  patch while here Mild obesity  I reviewed nursing notes, last 8  h vitals and pain scores, last 48 h intake and output, last 24 h labs and trends, and last 24 h imaging results.   LOS: 2 days    Burnard FORBES Banter , [facility] 01/03/2025, 11:25 AM Please see Amion for pager number during day hours 7:00am-4:30pm or 7:00am -11:30am on weekends  "

## 2025-01-03 NOTE — Progress Notes (Signed)
" °   01/03/25 1036  TOC Brief Assessment  Insurance and Status Reviewed  Patient has primary care physician Yes  Home environment has been reviewed home alone  Prior level of function: independent  Prior/Current Home Services No current home services  Social Drivers of Health Review SDOH reviewed no interventions necessary  Readmission risk has been reviewed Yes  Transition of care needs no transition of care needs at this time    "

## 2025-01-04 MED ORDER — FAMOTIDINE 20 MG PO TABS: 20.0000 mg | ORAL_TABLET | Freq: Two times a day (BID) | ORAL | Status: DC

## 2025-01-04 NOTE — Care Management Important Message (Signed)
 Important Message  Patient Details IM Letter given. Name: Francis Soto MRN: [id number] Date of Birth: [birth year]   Important Message Given:  Yes - Medicare IM     [name] 01/04/2025, 2:31 PM

## 2025-01-04 NOTE — Discharge Summary (Signed)
 "   Patient ID: Francis Soto [id number] 01-Aug-1956 69 y.o.  Admit date: 01/01/2025 Discharge date: 01/04/2025  Admitting Diagnosis: SBO  Discharge Diagnosis Patient Active Problem List   Diagnosis Date Noted   SBO (small bowel obstruction) (HCC) 01/01/2025   Recurrent acute suppurative otitis media without spontaneous rupture of left tympanic membrane 09/30/2023   PND (post-nasal drip) 09/30/2023   ETD (Eustachian tube dysfunction), bilateral 05/27/2022   Leucocytosis 02/20/2019   Insomnia 02/20/2019   Hyponatremia 02/19/2019   Chronic rhinitis 03/17/2017   Class 2 obesity without serious comorbidity with body mass index (BMI) of 35.0 to 35.9 in adult 03/17/2017   Claudication, intermittent 03/17/2017   Loss of libido 03/17/2017   Mild intermittent asthma without complication 03/17/2017   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 On Saturday he was lying on his abdomen watching the football game with no issues. About an hour after that he developed pain in his hernia. It persisted. He went to the ED on Sunday and found to have small bowel in this hernia. It was reduced and he was able to go home. He returns to the ED today with no abdominal pain but with N/V that started in the last 24 hrs. He had a normal BM last night and last flatus was this am. His WBC is 21K which is up from 18K on Saturday. His hgb is also 17, most c/w dehydration possibly. He underwent a new CT scan that reveals a SBO with mesenteric swirling concerning for a volvulus vs internal hernia. His lactic acid is normal along with his potassium. We have been asked to see him.   Procedures none  Hospital Course:  The patient was admitted and underwent the  SBO protocol.  He rapidly resolved his SBO with conservative management.  His NGT was removed and his diet was advanced as tolerates.  He was having bowel function with no further issues.  He will follow up to discuss elective hernia repair  Physical Exam: Abd: soft, NT, ND, stable umbilical hernia  Allergies as of 01/04/2025   No Known Allergies      Medication List     TAKE these medications    amLODipine  10 MG tablet Commonly known as: NORVASC  TAKE 1 TABLET BY MOUTH DAILY What changed: when to take this   aspirin  EC 325 MG tablet Take 650 mg by mouth See admin instructions. Take 650 mg by mouth in the morning and evening   azelastine  0.1 % nasal spray Commonly known as: ASTELIN  Place 2 sprays into both nostrils 2 (two) times daily. What changed: when to take this   Emergen-C Vitamin C Pack Take 1 packet by mouth See admin instructions. Mix 1 packet as directed and drink by mouth two to three times a week   fluticasone  50 MCG/ACT nasal spray Commonly known as: FLONASE  Place 1 spray into both nostrils daily as needed (for unresolved allergies and/or rhinitis).   lisinopril  20 MG tablet Commonly known as: ZESTRIL  TAKE 1 TABLET BY MOUTH DAILY What changed: when to take this   nicotine  21 mg/24hr patch Commonly known as: NICODERM CQ  - dosed in mg/24 hours Place 21 mg onto the skin daily as needed (for smoking cessation).  Pedialyte Advanced Care Soln Take 240 mLs by mouth See admin instructions. Drink 240 ml's by mouth two to three times a week   predniSONE  50 MG tablet Commonly known as: DELTASONE  Take 1 tablet daily for 5 days.   Pseudoephedrine  HCl 240 MG Tb24 Take 1 tablet (240 mg total) by mouth daily.          Follow-up Information     Sheldon Standing, MD Follow up in 1 month(s).   Specialties: General Surgery, Colon and Rectal Surgery Why: Call to follow up to get your hernia fixed. Contact information: [address] [place]  [address] [phone]                 Signed: Burnard Banter, [facility] 01/04/2025, 12:30 PM Please see Amion for pager number during day hours 7:00am-4:30pm, 7-11:30am on Weekends   "

## 2025-01-04 NOTE — Plan of Care (Signed)

## 2025-01-10 NOTE — Progress Notes (Signed)
 Shivaay Stormont                                          MRN: [id number]   01/10/2025   The [facility] Quality Team Specialist reviewed this patient medical record for the purposes of chart review for care gap closure. The following were reviewed: chart review for care gap closure-colorectal cancer screening.    VBCI Quality Team

## 2025-01-21 ENCOUNTER — Telehealth: Payer: Self-pay | Admitting: Nurse Practitioner

## 2025-01-22 ENCOUNTER — Encounter: Payer: Medicare (Managed Care) | Admitting: Family Medicine

## 2025-02-27 ENCOUNTER — Encounter: Payer: Medicare (Managed Care) | Admitting: Family Medicine
